# Patient Record
Sex: Male | Born: 1950 | Race: White | Hispanic: No | Marital: Married | Smoking: Former smoker
Health system: Southern US, Community
[De-identification: ages and names within clinical notes are randomized; demographics above are authoritative.]

## PROBLEM LIST (undated history)

## (undated) DIAGNOSIS — I700 Atherosclerosis of aorta: Secondary | ICD-10-CM

## (undated) DIAGNOSIS — E785 Hyperlipidemia, unspecified: Secondary | ICD-10-CM

## (undated) DIAGNOSIS — J309 Allergic rhinitis, unspecified: Secondary | ICD-10-CM

## (undated) DIAGNOSIS — M25559 Pain in unspecified hip: Secondary | ICD-10-CM

## (undated) DIAGNOSIS — K219 Gastro-esophageal reflux disease without esophagitis: Secondary | ICD-10-CM

## (undated) DIAGNOSIS — I6529 Occlusion and stenosis of unspecified carotid artery: Secondary | ICD-10-CM

## (undated) DIAGNOSIS — F419 Anxiety disorder, unspecified: Secondary | ICD-10-CM

## (undated) DIAGNOSIS — R059 Cough, unspecified: Secondary | ICD-10-CM

## (undated) DIAGNOSIS — M1990 Unspecified osteoarthritis, unspecified site: Secondary | ICD-10-CM

## (undated) HISTORY — DX: Allergic rhinitis, unspecified: J30.9

## (undated) HISTORY — DX: Atherosclerosis of aorta: I70.0

## (undated) HISTORY — DX: Gastro-esophageal reflux disease without esophagitis: K21.9

## (undated) HISTORY — DX: Unspecified osteoarthritis, unspecified site: M19.90

## (undated) HISTORY — DX: Hyperlipidemia, unspecified: E78.5

## (undated) HISTORY — PX: HERNIA REPAIR: SHX51

## (undated) HISTORY — DX: Occlusion and stenosis of unspecified carotid artery: I65.29

## (undated) HISTORY — DX: Anxiety disorder, unspecified: F41.9

## (undated) HISTORY — DX: Cough, unspecified: R05.9

## (undated) HISTORY — DX: Pain in unspecified hip: M25.559

---

## 2003-02-06 ENCOUNTER — Ambulatory Visit (HOSPITAL_COMMUNITY): Admission: RE | Admit: 2003-02-06 | Discharge: 2003-02-06 | Payer: Self-pay | Admitting: Internal Medicine

## 2004-06-02 ENCOUNTER — Ambulatory Visit: Payer: Self-pay | Admitting: Internal Medicine

## 2006-07-14 ENCOUNTER — Ambulatory Visit: Payer: Self-pay | Admitting: Internal Medicine

## 2006-08-30 ENCOUNTER — Ambulatory Visit (HOSPITAL_BASED_OUTPATIENT_CLINIC_OR_DEPARTMENT_OTHER): Admission: RE | Admit: 2006-08-30 | Discharge: 2006-08-30 | Payer: Self-pay | Admitting: General Surgery

## 2009-06-20 ENCOUNTER — Ambulatory Visit: Payer: Self-pay | Admitting: Vascular Surgery

## 2009-07-02 ENCOUNTER — Ambulatory Visit: Payer: Self-pay | Admitting: Vascular Surgery

## 2009-07-02 ENCOUNTER — Inpatient Hospital Stay (HOSPITAL_COMMUNITY): Admission: RE | Admit: 2009-07-02 | Discharge: 2009-07-03 | Payer: Self-pay | Admitting: Vascular Surgery

## 2009-07-02 ENCOUNTER — Encounter: Payer: Self-pay | Admitting: Vascular Surgery

## 2009-07-02 HISTORY — PX: CAROTID ENDARTERECTOMY: SUR193

## 2009-07-21 HISTORY — PX: EYE SURGERY: SHX253

## 2009-07-25 ENCOUNTER — Ambulatory Visit: Payer: Self-pay | Admitting: Vascular Surgery

## 2010-02-19 ENCOUNTER — Ambulatory Visit: Payer: Self-pay | Admitting: Vascular Surgery

## 2010-06-11 LAB — COMPREHENSIVE METABOLIC PANEL
ALT: 20 U/L (ref 0–53)
AST: 22 U/L (ref 0–37)
Albumin: 4.4 g/dL (ref 3.5–5.2)
Alkaline Phosphatase: 73 U/L (ref 39–117)
BUN: 9 mg/dL (ref 6–23)
CO2: 27 mEq/L (ref 19–32)
Calcium: 9.6 mg/dL (ref 8.4–10.5)
Chloride: 110 mEq/L (ref 96–112)
Creatinine, Ser: 0.78 mg/dL (ref 0.4–1.5)
GFR calc Af Amer: >60 mL/min (ref >60)
GFR calc non Af Amer: >60 mL/min (ref >60)
Glucose, Bld: 92 mg/dL (ref 70–99)
Potassium: 5 mEq/L (ref 3.5–5.1)
Sodium: 141 mEq/L (ref 135–145)
Total Bilirubin: 0.6 mg/dL (ref 0.3–1.2)
Total Protein: 6.6 g/dL (ref 6.0–8.3)

## 2010-06-11 LAB — URINALYSIS, ROUTINE W REFLEX MICROSCOPIC
Bilirubin Urine: NEGATIVE
Glucose, UA: NEGATIVE mg/dL
Hgb urine dipstick: NEGATIVE
Ketones, ur: NEGATIVE mg/dL
Nitrite: NEGATIVE
Protein, ur: NEGATIVE mg/dL
Specific Gravity, Urine: 1.017 (ref 1.005–1.030)
Urobilinogen, UA: 1 mg/dL (ref 0.0–1.0)
pH: 7.5 (ref 5.0–8.0)

## 2010-06-11 LAB — CBC
HCT: 35 % — ABNORMAL LOW (ref 39.0–52.0)
HCT: 40.6 % (ref 39.0–52.0)
Hemoglobin: 12.1 g/dL — ABNORMAL LOW (ref 13.0–17.0)
Hemoglobin: 13.6 g/dL (ref 13.0–17.0)
MCHC: 33.6 g/dL (ref 30.0–36.0)
MCHC: 34.5 g/dL (ref 30.0–36.0)
MCV: 95.9 fL (ref 78.0–100.0)
MCV: 96.1 fL (ref 78.0–100.0)
Platelets: 131 10*3/uL — ABNORMAL LOW (ref 150–400)
Platelets: 81 10*3/uL — ABNORMAL LOW (ref 150–400)
RBC: 3.65 MIL/uL — ABNORMAL LOW (ref 4.22–5.81)
RBC: 4.22 MIL/uL (ref 4.22–5.81)
RDW: 13 % (ref 11.5–15.5)
RDW: 13.4 % (ref 11.5–15.5)
WBC: 6 10*3/uL (ref 4.0–10.5)
WBC: 7.8 10*3/uL (ref 4.0–10.5)

## 2010-06-11 LAB — TYPE AND SCREEN
ABO/RH(D): O POS
Antibody Screen: NEGATIVE

## 2010-06-11 LAB — BASIC METABOLIC PANEL
BUN: 6 mg/dL (ref 6–23)
CO2: 24 mEq/L (ref 19–32)
Calcium: 9.4 mg/dL (ref 8.4–10.5)
Chloride: 110 mEq/L (ref 96–112)
Creatinine, Ser: 0.82 mg/dL (ref 0.4–1.5)
GFR calc Af Amer: >60 mL/min (ref >60)
GFR calc non Af Amer: >60 mL/min (ref >60)
Glucose, Bld: 140 mg/dL — ABNORMAL HIGH (ref 70–99)
Potassium: 4 mEq/L (ref 3.5–5.1)
Sodium: 142 mEq/L (ref 135–145)

## 2010-06-11 LAB — PROTIME-INR
INR: 0.96 (ref 0.00–1.49)
Prothrombin Time: 12.7 seconds (ref 11.6–15.2)

## 2010-06-11 LAB — APTT: aPTT: 25 seconds (ref 24–37)

## 2010-06-11 LAB — MRSA PCR SCREENING: MRSA by PCR: NEGATIVE

## 2010-06-11 LAB — ABO/RH: ABO/RH(D): O POS

## 2010-08-05 NOTE — Procedures (Signed)
CAROTID DUPLEX EXAM   INDICATION:  Carotid artery disease followup   HISTORY:  Diabetes:  no  Cardiac:  no  Hypertension:  no  Smoking:  Previous  Previous Surgery:  Right carotid endarterectomy 07/02/2009  CV History:  Currently asymptomatic  Amaurosis Fugax No, Paresthesias No, Hemiparesis No                                       RIGHT             LEFT  Brachial systolic pressure:  Brachial Doppler waveforms:  Vertebral direction of flow:        Not visualized    Antegrade  DUPLEX VELOCITIES (cm/sec)  CCA peak systolic                   90                120  ECA peak systolic                   103               89  ICA peak systolic                   76                74  ICA end diastolic                   28                26  PLAQUE MORPHOLOGY:                                    calcific  PLAQUE AMOUNT:                      none              Minimal  PLAQUE LOCATION:                                      ICA, bifurcation   IMPRESSION:  1. No evidence of restenosis, status post right carotid      endarterectomy.  2. Left internal carotid artery velocities are within normal limits.         ___________________________________________  Di Kindle. Edilia Bo, M.D.   EM/MEDQ  D:  02/20/2010  T:  02/20/2010  Job:  [id number]

## 2010-08-05 NOTE — Assessment & Plan Note (Signed)
OFFICE VISIT   WARRICK, LLERA A  DOB:  18-Aug-1950                                       02/19/2010  ZOXWR#:[id number]   Established patient level IV   I saw Mr. Moya in the office today for follow-up of his carotid  disease.  He had undergone a right carotid endarterectomy in April of  this year for an asymptomatic tight right carotid stenosis.  He comes in  for a routine 6 month follow-up visit.  Since I saw him last, he has had  no history of stroke, TIAs, expressive or receptive aphasia or amaurosis  fugax.   His past medical history has not changed since I saw him last.  He does  have a history of hyperlipidemia which has been stable on current  medications.  Medical issues otherwise unremarkable with no history of  diabetes, hypertension, or history of heart disease.   SOCIAL HISTORY:  He is married.  He quit tobacco in May 2008.  He does  not drink alcohol on a regular basis.   REVIEW OF SYSTEMS:  CARDIOVASCULAR:  He has had no chest pain, chest  pressure, palpitations or arrhythmias.  He has had no significant  dyspnea on exertion.  He has had no claudication, rest pain or  nonhealing ulcers.  He has had no history of DVT or phlebitis.  PULMONARY:  He has had no productive cough bronchitis, asthma or  wheezing.   PHYSICAL EXAMINATION:  This is a pleasant 60 year old gentleman who  appears his stated age.  Blood pressure 124/70, heart rate is 71, respiratory rate is 16.  LUNGS:  Clear bilaterally to auscultation without rales, rhonchi or  wheezing.  CARDIOVASCULAR:  I do not detect any carotid bruits.  He has a regular  rate and rhythm.  He has palpable femoral pulses and warm well-perfused  feet.  ABDOMEN:  Soft and nontender with normal pitched bowel sounds.  NEUROLOGIC:  He has no focal weakness or paresthesias.   I did independently interpret his carotid duplex scan which shows that  his right carotid endarterectomy site is widely  patent.  He has no  significant stenosis on the left.   I have reassured him that he has no evidence of recurrent carotid  stenosis on the right.  I have recommended a follow-up carotid duplex  scan in 1 year and I have ordered this study.  I will plan on seeing him  back until 2 years unless there has been any change in his duplex study.  In the meantime, he knows to continue taking his aspirin and let us know  if he develops any new neurologic symptoms.     Di Kindle. Edilia Bo, M.D.  Electronically Signed   CSD/MEDQ  D:  02/19/2010  T:  02/20/2010  Job:  [id number]   cc:   Corky Crafts, MD

## 2010-08-05 NOTE — Op Note (Signed)
NAME:  MARCELUS, DUBBERLY NO.:  [id number]   MEDICAL RECORD NO.:  [id number]          PATIENT TYPE:  AMB   LOCATION:  DSC                          FACILITY:  [facility]   PHYSICIAN:  Gabrielle Dare. Janee Morn, M.D.DATE OF BIRTH:  10-15-1950   DATE OF PROCEDURE:  08/30/2006  DATE OF DISCHARGE:                               OPERATIVE REPORT   PREOPERATIVE DIAGNOSIS:  Left inguinal hernia.   POSTOPERATIVE DIAGNOSIS:  Left inguinal hernia.   PROCEDURE:  Repair of left inguinal hernia with mesh.   SURGEON:  Gabrielle Dare. Janee Morn, M.D.   ANESTHESIA:  General with laryngeal mask airway.   HISTORY OF PRESENT ILLNESS:  Alex [name] is a 60 year old gentleman who  I evaluated in the office for a symptomatic left inguinal hernia.  He  presents today for elective repair.   PROCEDURE IN DETAIL:  Informed consent was obtained.  The patient  received intravenous antibiotics.  A site was marked.  He was brought to  the operating room.  General anesthesia with laryngeal mask airway was  administered by the anesthesia staff.  His abdomen and groins were  prepped and draped in sterile fashion.  Marcaine 0.25% with epinephrine  was injected out towards the left anterior superior iliac spine, and  along our planned left inguinal incision line.  A left inguinal incision  was made.  Subcutaneous tissues were dissected down through Scarpa  fascia, revealing external oblique fascia.  This was somewhat attenuated  medially due to the hernia.  The external oblique fascia was divided  laterally and the division was carried down sharply through the external  ring.  The inferior leaflet was dissected free off the cord structures,  revealing the shelving edge of the inguinal ligament.  The ilioinguinal  nerve was divided, as it was very adherent to the hernia sac; this was  done to prevent postoperative nerve pain.  Next, superior leaflet of the  external oblique was dissected free off the transversalis.   The floor  was inspected, revealing a direct inguinal hernia; it was easily  reducible,  This was dissected free from surrounding tissues and the  cord structures.  It was then reduced manually and was held in the  reduced position,  with a figure-of-eight 2-0 Vicryl suture connecting  the transversalis to the shelving edge of inguinal ligament.  Subsequently the cord was inspected.  There was no evidence of a direct  inguinal hernia.  The hernia repair was then completed with a keyhole  polypropylene mesh.  This was secured medially to the tissues over the  pubic tubercle with 0 Prolene; was secured in a running fashion  inferiorly along the shelving edge of the inguinal ligament with 0  Prolene.  It was then tacked in an interrupted fashion again with 0  Prolene, first to the tissues over the pubic tubercle again, and then  extending out laterally on the superior part of the mesh along the  transversalis.  The two leaflets of the mesh were rejoined behind the  cord, recreating the ring.  These were tacked together and down to the  underlying musculature with  interrupted 0 Prolene sutures.  The aperture  in the mesh was adjusted so that the tip of a fifth digit fit next to  the cord structures.  The cord structures remained viable and  nonedematous.  The area was copiously irrigated.  Additional local  anesthetic was injected.  Meticulous hemostasis was assured.  The  external oblique was then closed with running 2-0 Vicryl sutures.  Subcutaneous tissues were irrigated.  The Scarpa fascia was closed with  interrupted 3-0 Vicryl sutures.  The skin was closed with a running 4-0  Monocryl subcuticular stitch.  Sponge, needle and  instrument counts were correct.  Benzoin, Steri-Strips and sterile  dressings were applied.  The patient's left testicle was relocated in  the anatomic position in the scrotum at completion of the procedure.  He  was taken to the recovery room in stable condition,  after tolerating the  procedure without apparent complication.      Gabrielle Dare Janee Morn, M.D.  Electronically Signed     BET/MEDQ  D:  08/30/2006  T:  08/30/2006  Job:  [id number]   cc:   Corwin Levins, MD

## 2010-08-05 NOTE — Assessment & Plan Note (Signed)
OFFICE VISIT   Alex [name], Alex [name]  DOB:  [birth year]                                       07/25/2009  AVWUJ#:[id number]   I saw the patient in the office today for followup after his recent  right carotid endarterectomy.  This is [name] pleasant 60 year old gentleman  who was referred with [name] greater than 80% right carotid stenosis.  Right  carotid endarterectomy was recommended in order to lower his risk of  future stroke.  He underwent right carotid endarterectomy with Dacron  patch angioplasty on 07/02/2009.  He did well postoperatively and was  discharged on postoperative day #1.  He returns for his first outpatient  visit.  He has no specific complaints and overall has been doing quite  well.  He has had no focal weakness or paresthesias.   PHYSICAL EXAMINATION:  Blood pressure is 132/76, heart rate is 78.  His  neck incision is healing nicely.  Lungs are clear bilaterally to  auscultation.  Neurologic exam is nonfocal.   Overall I am pleased with his progress and I will see him back in 6  months for followup carotid duplex scan.  He did not have any  significant carotid stenosis on the left on his preoperative study.  He  does know to continue taking his aspirin.     Di Kindle. Edilia Bo, M.D.  Electronically Signed   CSD/MEDQ  D:  07/25/2009  T:  07/26/2009  Job:  [id number]   cc:   L. Lupe Carney, M.D.  Corky Crafts, MD

## 2010-08-05 NOTE — Procedures (Signed)
CAROTID DUPLEX EXAM   INDICATION:  Repeat of right carotid for evaluation of critical  stenosis.   HISTORY:  Diabetes:  No.  Cardiac:  No.  Hypertension:  No.  Smoking:  Previous.  Previous Surgery:  No.  CV History:  Asymptomatic.  Amaurosis Fugax Yes No, Paresthesias Yes No, Hemiparesis Yes No                                       RIGHT             LEFT  Brachial systolic pressure:  Brachial Doppler waveforms:  Vertebral direction of flow:        Antegrade  DUPLEX VELOCITIES (cm/sec)  CCA peak systolic                   80  ECA peak systolic                   113  ICA peak systolic                   313  ICA end diastolic                   119  PLAQUE MORPHOLOGY:                  Calcified / mixed  PLAQUE AMOUNT:                      Critical  PLAQUE LOCATION:                    Bifurcation and ICA   IMPRESSION:  80%-99% stenosis noted in the right internal carotid  artery.   ___________________________________________  Di Kindle. Edilia Bo, M.D.   CJ/MEDQ  D:  06/20/2009  T:  06/20/2009  Job:  [id number]

## 2010-08-05 NOTE — H&P (Signed)
HISTORY AND PHYSICAL EXAMINATION   June 20, 2009   Re:  Alex [name], Alex [name]             DOB:  08/29/1950   I saw the patient in the office today in consultation concerning [name]  greater than 80% right carotid stenosis.  This is [name] pleasant 60 year old  right-handed gentleman who was found to have [name] carotid bruit.  This  prompted [name] duplex scan which showed evidence of an 80% right carotid  stenosis.  He was sent for vascular consultation.  Of note, he denies  any history of stroke, TIAs, expressive or receptive aphasia, or  amaurosis fugax.   PAST MEDICAL HISTORY:  Is significant for hyperlipidemia which has been  stable on his current medications.  He also has [name] history of tobacco  abuse in the past but quit in May of 2008.  His past medical history is  otherwise fairly unremarkable.  He denies any history of diabetes,  hypertension, history of previous myocardial infarction, history of  congestive heart failure or history of COPD.   PAST SURGICAL HISTORY:  Significant for hernia repair in 2008.   FAMILY HISTORY:  There is no history of premature cardiovascular  disease.   SOCIAL HISTORY:  He is married.  He has one child.  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  He quit tobacco in May of 2008.  He does  not drink alcohol on [name] regular basis.   ALLERGIES:  No known drug allergies.   MEDICATIONS:  1. Simvastatin 40 mg p.o. daily.  2. Aspirin 81 mg p.o. daily.  3. Multivitamin one p.o. daily.   REVIEW OF SYSTEMS:  GENERAL:  He has had no recent weight loss, weight  gain or problem with his appetite.  CARDIOVASCULAR:  He has had no chest pain, chest pressure, palpitations  or arrhythmias.  He does admit to some dyspnea on exertion.  He has had  no claudication, rest pain or nonhealing ulcers.  He has had no history  of DVT or phlebitis.  MUSCULOSKELETAL:  He does have occasional arthritic pain in his knees.  Pulmonary, GI, GU, neurologic, psychiatric,  ENT and hematologic review  of systems is unremarkable and is documented on the medical history form  in his chart.   PHYSICAL EXAMINATION:  General:  This is [name] pleasant 60 year old  gentleman who appears his stated age.  Vital signs:  His blood pressure  is 129/70, heart rate is 70, respiratory rate 24.  HEENT:  Unremarkable.  Lungs:  Are clear bilaterally to auscultation without rales, rhonchi or  wheezing.  Cardiovascular:  He has bilateral carotid bruits.  He has [name]  regular rate and rhythm without murmur.  He has no significant  peripheral edema.  He has palpable femoral, radial and posterior tibial  pulses bilaterally.  Abdomen:  Soft and nontender with normal pitched  bowel sounds.  I cannot appreciate an aneurysm.  No masses are  appreciated.  Musculoskeletal:  There are no major deformities or  cyanosis.  Neurologic:  He has no focal weakness or paresthesias.  Skin:  There are no ulcers or rashes.   I have independently interpreted his carotid duplex scan of the right  carotid artery in our office to confirm the findings from the duplex at  [facility].  He has [name] peak systolic velocity of 313 cm/second, an  end-diastolic velocity of 119 cm/second consistent with an 80%-99% right  internal carotid artery stenosis.  I also reviewed his previous Doppler  which showed no evidence of significant carotid stenosis on the left.  I  also reviewed his laboratory evaluation which shows [name] creatinine of 0.9.  His white count was 6.5.  H and H of 15 and 44.   Given the severity of the right carotid stenosis I have recommended  right carotid endarterectomy in order to lower his risk of future  stroke.  We have discussed the indications for surgery and the potential  complications including but not limited to bleeding, stroke (peri  procedural risk 1-2%), nerve injury, MI, or other unpredictable medical  problems.  All of his questions were answered and he is agreeable to  proceed.  His  surgery has been scheduled for April 12.  He knows to  continue his aspirin right up through surgery.     Di Kindle. Edilia Bo, M.D.  Electronically Signed   CSD/MEDQ  D:  06/20/2009  T:  06/21/2009  Job:  [id number]   cc:   Corky Crafts, MD  L. Lupe Carney, M.D.

## 2011-01-08 LAB — POCT HEMOGLOBIN-HEMACUE
Hemoglobin: 13.1
Operator id: 23949

## 2011-02-16 ENCOUNTER — Ambulatory Visit (INDEPENDENT_AMBULATORY_CARE_PROVIDER_SITE_OTHER): Payer: 59 | Admitting: *Deleted

## 2011-02-16 DIAGNOSIS — I6529 Occlusion and stenosis of unspecified carotid artery: Secondary | ICD-10-CM

## 2011-02-16 DIAGNOSIS — Z48812 Encounter for surgical aftercare following surgery on the circulatory system: Secondary | ICD-10-CM

## 2011-02-19 ENCOUNTER — Other Ambulatory Visit: Payer: Self-pay

## 2011-02-20 ENCOUNTER — Encounter: Payer: Self-pay | Admitting: Vascular Surgery

## 2011-02-20 NOTE — Procedures (Unsigned)
CAROTID DUPLEX EXAM  INDICATION:  Carotid endarterectomy.  HISTORY: Diabetes:  No. Cardiac:  No. Hypertension:  No. Smoking:  Previous. Previous Surgery:  Right carotid endarterectomy on 07/02/2009. CV History:  Currently asymptomatic. Amaurosis Fugax No, Paresthesias No, Hemiparesis No.                                      RIGHT             LEFT Brachial systolic pressure:         132               130 Brachial Doppler waveforms:         Normal            Normal Vertebral direction of flow:        Antegrade/resistive                 Antegrade DUPLEX VELOCITIES (cm/sec) CCA peak systolic                   93                106 ECA peak systolic                   91                92 ICA peak systolic                   75                96 ICA end diastolic                   32                28 PLAQUE MORPHOLOGY:                                    Heterogenous PLAQUE AMOUNT:                      None              Mild PLAQUE LOCATION:                                      ICA/ECA  IMPRESSION: 1. Patent right carotid endarterectomy site with no hemodynamically     significant stenosis of the bilateral internal carotid arteries. 2. The antegrade right vertebral artery demonstrates resistive flow. 3. No significant change noted when compared to the previous     examination on 06/19/2009.  ___________________________________________ Di Kindle. Edilia Bo, M.D.  CH/MEDQ  D:  02/17/2011  T:  02/17/2011  Job:  [id number]

## 2011-02-23 ENCOUNTER — Other Ambulatory Visit: Payer: Self-pay

## 2011-02-23 DIAGNOSIS — I6529 Occlusion and stenosis of unspecified carotid artery: Secondary | ICD-10-CM

## 2011-02-23 DIAGNOSIS — I6359 Cerebral infarction due to unspecified occlusion or stenosis of other cerebral artery: Secondary | ICD-10-CM

## 2011-02-23 DIAGNOSIS — Z48812 Encounter for surgical aftercare following surgery on the circulatory system: Secondary | ICD-10-CM

## 2012-02-05 ENCOUNTER — Encounter: Payer: Self-pay | Admitting: Vascular Surgery

## 2012-02-17 ENCOUNTER — Other Ambulatory Visit: Payer: 59

## 2012-02-17 ENCOUNTER — Ambulatory Visit: Payer: 59 | Admitting: Vascular Surgery

## 2012-02-23 ENCOUNTER — Encounter: Payer: Self-pay | Admitting: Neurosurgery

## 2012-02-24 ENCOUNTER — Ambulatory Visit (INDEPENDENT_AMBULATORY_CARE_PROVIDER_SITE_OTHER): Payer: 59 | Admitting: Neurosurgery

## 2012-02-24 ENCOUNTER — Encounter: Payer: Self-pay | Admitting: Neurosurgery

## 2012-02-24 ENCOUNTER — Ambulatory Visit (INDEPENDENT_AMBULATORY_CARE_PROVIDER_SITE_OTHER): Payer: 59 | Admitting: *Deleted

## 2012-02-24 VITALS — BP 108/62 | HR 85 | Resp 18 | Ht 67.0 in | Wt 140.0 lb

## 2012-02-24 DIAGNOSIS — Z48812 Encounter for surgical aftercare following surgery on the circulatory system: Secondary | ICD-10-CM

## 2012-02-24 DIAGNOSIS — I6529 Occlusion and stenosis of unspecified carotid artery: Secondary | ICD-10-CM

## 2012-02-24 NOTE — Progress Notes (Signed)
[facility] Carotid Office Note  CC: Carotid surveillance Referring Physician: Edilia Bo  History of Present Illness: 61 year old male patient of Dr. Edilia Bo status post right CEA in 2011. The patient denies any signs or symptoms of CVA, TIA, amaurosis fugax or any neural deficit. The patient denies any new medical diagnoses or recent surgery.  Past Medical History  Diagnosis Date  . Carotid artery occlusion   . Hyperlipidemia   . Arthritis     ROS: [x]  Positive   [ ]  Denies    General: [ ]  Weight loss, [ ]  Fever, [ ]  chills Neurologic: [ ]  Dizziness, [ ]  Blackouts, [ ]  Seizure [ ]  Stroke, [ ]  "Mini stroke", [ ]  Slurred speech, [ ]  Temporary blindness; [ ]  weakness in arms or legs, [ ]  Hoarseness Cardiac: [ ]  Chest pain/pressure, [ ]  Shortness of breath at rest [ ]  Shortness of breath with exertion, [ ]  Atrial fibrillation or irregular heartbeat Vascular: [ ]  Pain in legs with walking, [ ]  Pain in legs at rest, [ ]  Pain in legs at night,  [ ]  Non-healing ulcer, [ ]  Blood clot in vein/DVT,   Pulmonary: [ ]  Home oxygen, [ ]  Productive cough, [ ]  Coughing up blood, [ ]  Asthma,  [ ]  Wheezing Musculoskeletal:  [ ]  Arthritis, [ ]  Low back pain, [ ]  Joint pain Hematologic: [ ]  Easy Bruising, [ ]  Anemia; [ ]  Hepatitis Gastrointestinal: [ ]  Blood in stool, [ ]  Gastroesophageal Reflux/heartburn, [ ]  Trouble swallowing Urinary: [ ]  chronic Kidney disease, [ ]  on HD - [ ]  MWF or [ ]  TTHS, [ ]  Burning with urination, [ ]  Difficulty urinating Skin: [ ]  Rashes, [ ]  Wounds Psychological: [ ]  Anxiety, [ ]  Depression   Social History History  Substance Use Topics  . Smoking status: Former Smoker    Types: Cigarettes    Quit date: 06/21/2005  . Smokeless tobacco: Never Used  . Alcohol Use: Yes    Family History Family History  Problem Relation Age of Onset  . Family history unknown: Yes    No Known Allergies  Current Outpatient Prescriptions  Medication  Sig Dispense Refill  . aspirin 81 MG tablet Take 81 mg by mouth daily.      Marland Kitchen imipramine (TOFRANIL) 25 MG tablet daily.      . Multiple Vitamin (MULTIVITAMIN) tablet Take 1 tablet by mouth daily.      . simvastatin (ZOCOR) 40 MG tablet Take 40 mg by mouth every evening.        Physical Examination  Filed Vitals:   02/24/12 1341  BP: 108/62  Pulse: 85  Resp:     Body mass index is 21.93 kg/(m^2).  General:  WDWN in NAD Gait: Normal HEENT: WNL Eyes: Pupils equal Pulmonary: normal non-labored breathing , without Rales, rhonchi,  wheezing Cardiac: RRR, without  Murmurs, rubs or gallops; Abdomen: soft, NT, no masses Skin: no rashes, ulcers noted  Vascular Exam Pulses: 3+ radial pulses bilateral Carotid bruits: Carotid pulses to auscultation no bruits are heard Extremities without ischemic changes, no Gangrene , no cellulitis; no open wounds;  Musculoskeletal: no muscle wasting or atrophy   Neurologic: A&O X 3; Appropriate Affect ; SENSATION: normal; MOTOR FUNCTION:  moving all extremities equally. Speech is fluent/normal  Non-Invasive Vascular Imaging CAROTID DUPLEX 02/24/2012  Right ICA 0 - 19% stenosis Left ICA 20 - 39 % stenosis   ASSESSMENT/PLAN: Asymptomatic patient to year status post right CEA doing well. The  patient will followup in one year with repeat carotid duplex. The patient's questions were encouraged and answered, he is in agreement with this plan.  Lauree Chandler ANP   Clinic MD: Edilia Bo

## 2012-02-25 NOTE — Addendum Note (Signed)
Addended by: Sharee Pimple on: 02/25/2012 11:56 AM   Modules accepted: Orders

## 2013-02-06 ENCOUNTER — Ambulatory Visit (INDEPENDENT_AMBULATORY_CARE_PROVIDER_SITE_OTHER): Payer: BC Managed Care – PPO | Admitting: Interventional Cardiology

## 2013-02-06 ENCOUNTER — Encounter: Payer: Self-pay | Admitting: Interventional Cardiology

## 2013-02-06 VITALS — BP 108/64 | HR 81 | Ht 67.0 in | Wt 139.0 lb

## 2013-02-06 DIAGNOSIS — E782 Mixed hyperlipidemia: Secondary | ICD-10-CM | POA: Insufficient documentation

## 2013-02-06 DIAGNOSIS — I739 Peripheral vascular disease, unspecified: Secondary | ICD-10-CM

## 2013-02-06 DIAGNOSIS — R0600 Dyspnea, unspecified: Secondary | ICD-10-CM

## 2013-02-06 DIAGNOSIS — R0609 Other forms of dyspnea: Secondary | ICD-10-CM

## 2013-02-06 NOTE — Progress Notes (Signed)
Patient ID: Alex [name], male   DOB: 08-14-1950, 62 y.o.   MRN: [id number]    [address] [place], [place]  [address] Phone: [phone] Fax:  [phone]  Date:  02/06/2013   ID:  Alex [name], DOB [birth year], MRN [id number]  PCP:  Lupe Carney, MD      History of Present Illness: [name] is a 62 y.o. male who has had a CEA in 2011. He walks and hikes. He feels SHOB with walking up stairs and steep hills. It goes away after a few minutes. No hard chest pain. No change in that symptom. He just feels he cannot catch his breath.  His exercise tolerance has dropped.  Has occasional dizziness. Not related to exertion.  Walks up stairs. Hyperlipidemia:  Denies : Chest pain.  Palpitations.  Dizziness.  Leg edema.  Orthopnea.     Wt Readings from Last 3 Encounters:  02/06/13 139 lb (63.05 kg)  02/24/12 140 lb (63.504 kg)     Past Medical History  Diagnosis Date  . Carotid artery occlusion   . Hyperlipidemia   . Arthritis     Current Outpatient Prescriptions  Medication Sig Dispense Refill  . aspirin 81 MG tablet Take 81 mg by mouth daily.      Marland Kitchen imipramine (TOFRANIL) 25 MG tablet Take 25 mg by mouth as needed.       . Multiple Vitamin (MULTIVITAMIN) tablet Take 1 tablet by mouth daily.      . simvastatin (ZOCOR) 40 MG tablet Take 40 mg by mouth every evening.       No current facility-administered medications for this visit.    Allergies:   No Known Allergies  Social History:  The patient  reports that he quit smoking about 7 years ago. His smoking use included Cigarettes. He smoked 0.00 packs per day. He has never used smokeless tobacco. He reports that he drinks alcohol. He reports that he uses illicit drugs.   Family History:  The patient's family history includes Diabetes in his brother.   ROS:  Please see the history of present illness.  No nausea, vomiting.  No fevers, chills.  No focal weakness.  No dysuria. SHOB as noted above.  All other systems reviewed and negative.   PHYSICAL EXAM: VS:  BP 108/64  Pulse 81  Ht 5\' 7"  (1.702 m)  Wt 139 lb (63.05 kg)  BMI 21.77 kg/m2 Well nourished, well developed, in no acute distress HEENT: normal Neck: no JVD, no carotid bruits Cardiac:  normal S1, S2; RRR;  Lungs:  clear to auscultation bilaterally, no wheezing, rhonchi or rales Abd: soft, nontender, no hepatomegaly Ext: no edema Skin: warm and dry Neuro:   no focal abnormalities noted  EKG:  NSR, IRBBB , T wave inversion in V1    ASSESSMENT AND PLAN:  1. DOE: Patient with peripheral vascular disease. We have discussed stress testing in the past. His dyspnea on exertion is now worse. We'll plan for nuclear stress test. Also evaluate left ventricular ejection fraction. He does have some ST segment changes in V1 with downsloping ST depression. 2. Carotid artery disease: Following at [facility]. 3. Hyperlipidemia: Continue Zocor.  LDL 71 in March 2014.  Signed, Fredric Mare, MD, [facility] 02/06/2013 3:43 PM

## 2013-02-06 NOTE — Patient Instructions (Signed)
Your physician has requested that you have en exercise stress myoview. For further information please visit [email or web address] Please follow instruction sheet, as given.  Your physician wants you to follow-up in: 1 year with Dr. Eldridge Dace. You will receive a reminder letter in the mail two months in advance. If you don't receive a letter, please call our office to schedule the follow-up appointment.  Your physician recommends that you continue on your current medications as directed. Please refer to the Current Medication list given to you today.

## 2013-02-24 ENCOUNTER — Encounter: Payer: Self-pay | Admitting: Family

## 2013-02-27 ENCOUNTER — Ambulatory Visit (HOSPITAL_COMMUNITY)
Admission: RE | Admit: 2013-02-27 | Discharge: 2013-02-27 | Disposition: A | Discharge status: Home / Self Care | Payer: BC Managed Care – PPO | Source: Ambulatory Visit | Attending: Family | Admitting: Family

## 2013-02-27 ENCOUNTER — Ambulatory Visit: Payer: Self-pay | Admitting: Family

## 2013-02-27 DIAGNOSIS — Z48812 Encounter for surgical aftercare following surgery on the circulatory system: Secondary | ICD-10-CM

## 2013-02-27 DIAGNOSIS — I6529 Occlusion and stenosis of unspecified carotid artery: Secondary | ICD-10-CM | POA: Insufficient documentation

## 2013-02-28 ENCOUNTER — Other Ambulatory Visit: Payer: Self-pay | Admitting: *Deleted

## 2013-02-28 DIAGNOSIS — Z48812 Encounter for surgical aftercare following surgery on the circulatory system: Secondary | ICD-10-CM

## 2013-03-01 ENCOUNTER — Other Ambulatory Visit (HOSPITAL_COMMUNITY): Payer: 59

## 2013-03-01 ENCOUNTER — Ambulatory Visit: Payer: 59 | Admitting: Family

## 2013-03-06 ENCOUNTER — Encounter: Payer: Self-pay | Admitting: Cardiovascular Disease

## 2013-03-06 ENCOUNTER — Ambulatory Visit (HOSPITAL_COMMUNITY): Payer: BC Managed Care – PPO | Attending: Cardiovascular Disease | Admitting: Radiology

## 2013-03-06 VITALS — BP 117/81 | Ht 67.0 in | Wt 136.0 lb

## 2013-03-06 DIAGNOSIS — R0600 Dyspnea, unspecified: Secondary | ICD-10-CM

## 2013-03-06 DIAGNOSIS — R0609 Other forms of dyspnea: Secondary | ICD-10-CM | POA: Insufficient documentation

## 2013-03-06 DIAGNOSIS — I739 Peripheral vascular disease, unspecified: Secondary | ICD-10-CM | POA: Insufficient documentation

## 2013-03-06 DIAGNOSIS — R0602 Shortness of breath: Secondary | ICD-10-CM

## 2013-03-06 DIAGNOSIS — R0989 Other specified symptoms and signs involving the circulatory and respiratory systems: Secondary | ICD-10-CM | POA: Insufficient documentation

## 2013-03-06 DIAGNOSIS — R079 Chest pain, unspecified: Secondary | ICD-10-CM

## 2013-03-06 MED ORDER — TECHNETIUM TC 99M SESTAMIBI GENERIC - CARDIOLITE
10.0000 | Freq: Once | INTRAVENOUS | Status: AC | PRN
  Administered 2013-03-06: 10 via INTRAVENOUS

## 2013-03-06 MED ORDER — TECHNETIUM TC 99M SESTAMIBI GENERIC - CARDIOLITE
30.0000 | Freq: Once | INTRAVENOUS | Status: AC | PRN
  Administered 2013-03-06: 30 via INTRAVENOUS

## 2013-03-06 NOTE — Progress Notes (Signed)
[facility] SITE 3 NUCLEAR MED [address] [place], [place] [address] [phone]    Cardiology Nuclear Med Study  Alex [name] is a 62 y.o. male     MRN : [id number]     DOB: Oct 19, 1950  Procedure Date: 03/06/2013  Nuclear Med Background Indication for Stress Test:  Evaluation for Ischemia History:  No known History of CAD Cardiac Risk Factors: Carotid Disease, History of Smoking, Lipids and PVD  Symptoms:  Dizziness and DOE   Nuclear Pre-Procedure Caffeine/Decaff Intake:  None NPO After: 7:00pm   Lungs:  clear O2 Sat: 95% on room air. IV 0.9% NS with Angio Cath:  22g  IV Site: R Hand  IV Started by:  Cathlyn Parsons, RN  Chest Size (in):  38 Cup Size: n/a  Height: 5\' 7"  (1.702 m)  Weight:  136 lb (61.689 kg)  BMI:  Body mass index is 21.3 kg/(m^2). Tech Comments:  I had Dr. Eldridge Dace review the Stress Test, particularly the BP response, prior to patient leaving. He stated it was okay for patient to leave, and his office will f/u with final results.  Leonia Corona, RT-N    Nuclear Med Study 1 or 2 day study: 1 day  Stress Test Type:  Stress  Reading MD: Lance Muss, MD  Order Authorizing Provider:  Floydene Flock  Resting Radionuclide: Technetium 76m Sestamibi  Resting Radionuclide Dose: 11.0 mCi   Stress Radionuclide:  Technetium 29m Sestamibi  Stress Radionuclide Dose: 33.0 mCi           Stress Protocol Rest HR: 80 Stress HR: 136  Rest BP: 117/81 Stress BP: 177/77  Exercise Time (min): 6.20 METS: 6.20   Predicted Max HR: 156 bpm % Max HR: 87.18 bpm Rate Pressure Product: 21308   Dose of Adenosine (mg):  n/a Dose of Lexiscan: n/a mg  Dose of Atropine (mg): n/a Dose of Dobutamine: n/a mcg/kg/min (at max HR)  Stress Test Technologist: Frederick Peers, EMT-P  Nuclear Technologist:  Domenic Polite, CNMT     Rest Procedure:  Myocardial perfusion imaging was performed at rest 45 minutes following the intravenous administration of Technetium 26m  Sestamibi. Rest ECG: NSR, IRBBB  Stress Procedure:  The patient exercised on the treadmill utilizing the Bruce Protocol for 4:48 minutes. The patient stopped due to sob/leg fatigue and denied any chest pain.  Technetium 31m Sestamibi was injected at peak exercise and myocardial perfusion imaging was performed after a brief delay. and The patient received IV Lexiscan 0.4 mg over 15-seconds.  Technetium 47m Sestamibi injected at 30-seconds.  Quantitative spect images were obtained after a 45 minute delay. Stress ECG: No significant change from baseline ECG  QPS Raw Data Images:  Mild diaphragmatic attenuation.  Normal left ventricular size. Stress Images:  Normal homogeneous uptake in all areas of the myocardium. Rest Images:  There is decreased uptake in the inferior wall. Subtraction (SDS):  No evidence of ischemia. Transient Ischemic Dilatation (Normal <1.22):  0.89 Lung/Heart Ratio (Normal <0.45):  0.28  Quantitative Gated Spect Images QGS EDV:  62 ml QGS ESV:  23 ml  Impression Exercise Capacity:  Fair exercise capacity. BP Response:  Hypotensive blood pressure response. Clinical Symptoms:  There is dyspnea. ECG Impression:  No significant ST segment change suggestive of ischemia. Comparison with Prior Nuclear Study: No previous nuclear study performed  Overall Impression:  Low risk stress nuclear study .  Normal perfusion and TID ratio.  No ECG changes despite hypotension with exercise.  No chest  pain.  BP resolved post exercise.    . Let us know if he has any further symptoms.  Could consider angiography.  LV Ejection Fraction: 64%.  LV Wall Motion:  NL LV Function; NL Wall Motion  Corky Crafts., MD, [facility]

## 2013-03-07 ENCOUNTER — Encounter: Payer: Self-pay | Admitting: Vascular Surgery

## 2013-07-11 ENCOUNTER — Other Ambulatory Visit: Payer: Self-pay | Admitting: Interventional Cardiology

## 2014-02-07 ENCOUNTER — Ambulatory Visit (INDEPENDENT_AMBULATORY_CARE_PROVIDER_SITE_OTHER): Payer: BC Managed Care – PPO | Admitting: Interventional Cardiology

## 2014-02-07 ENCOUNTER — Encounter: Payer: Self-pay | Admitting: Interventional Cardiology

## 2014-02-07 VITALS — BP 114/72 | HR 78 | Ht 67.0 in | Wt 134.0 lb

## 2014-02-07 DIAGNOSIS — E782 Mixed hyperlipidemia: Secondary | ICD-10-CM

## 2014-02-07 NOTE — Patient Instructions (Signed)
Your physician recommends that you continue on your current medications as directed. Please refer to the Current Medication list given to you today.  Your physician recommends that you schedule a follow-up appointment AS NEEDED with Dr. Irish Lack.

## 2014-02-07 NOTE — Progress Notes (Signed)
Patient ID: Alex [name], male   DOB: May 23, 1950, 63 y.o.   MRN: [id number] Patient ID: Alex [name], male   DOB: 1950/04/22, 63 y.o.   MRN: [id number]    [place], [place], [place]  [address] Phone: [phone] Fax:  [phone]  Date:  02/07/2014   ID:  [place] [name], DOB 06-May-1950, MRN [id number]  PCP:  Donnie Coffin, MD      History of Present Illness: Alex [name] is a 63 y.o. male who has had a CEA in 2011. He walks and hikes. He feels SHOB with walking up stairs and steep hills. It goes away after a few minutes. No hard chest pain. No change in that symptom. Negative stress test in 12/14.   Has occasional dizziness. Not related to exertion. More with change in position.  Walks up stairs. Hyperlipidemia:  Denies : Chest pain.  Palpitations.  Dizziness.  Leg edema.  Orthopnea.     Wt Readings from Last 3 Encounters:  02/07/14 134 lb (60.782 kg)  03/06/13 136 lb (61.689 kg)  02/06/13 139 lb (63.05 kg)     Past Medical History  Diagnosis Date  . Carotid artery occlusion   . Hyperlipidemia   . Arthritis     Current Outpatient Prescriptions  Medication Sig Dispense Refill  . aspirin 81 MG tablet Take 81 mg by mouth daily.    . clonazePAM (KLONOPIN) 0.5 MG tablet Take 0.5 mg by mouth 2 (two) times daily as needed for anxiety.    Marland Kitchen imipramine (TOFRANIL) 25 MG tablet Take 25 mg by mouth as needed.     . Multiple Vitamin (MULTIVITAMIN) tablet Take 1 tablet by mouth daily.    . simvastatin (ZOCOR) 40 MG tablet TAKE 1 TABLET EVERY EVENING 90 tablet 1   No current facility-administered medications for this visit.    Allergies:   No Known Allergies  Social History:  The patient  reports that he quit smoking about 8 years ago. His smoking use included Cigarettes. He smoked 0.00 packs per day. He has never used smokeless tobacco. He reports that he drinks alcohol. He reports that he uses illicit drugs.   Family History:  The patient's  family history includes Diabetes in his brother.   ROS:  Please see the history of present illness.  No nausea, vomiting.  No fevers, chills.  No focal weakness.  No dysuria. SHOB as noted above. All other systems reviewed and negative.   PHYSICAL EXAM: VS:  BP 114/72 mmHg  Pulse 78  Ht 5\' 7"  (1.702 m)  Wt 134 lb (60.782 kg)  BMI 20.98 kg/m2 Well nourished, well developed, in no acute distress HEENT: normal Neck: no JVD, no carotid bruits Cardiac:  normal S1, S2; RRR;  Lungs:  clear to auscultation bilaterally, no wheezing, rhonchi or rales Abd: soft, nontender, no hepatomegaly Ext: no edema Skin: warm and dry Neuro:   no focal abnormalities noted  EKG:  NSR, IRBBB , T wave inversion in V1    ASSESSMENT AND PLAN:  1. DOE: Stable.  Negative nuclear stress test in 2014 with normal left ventricular ejection fraction. He does have some ST segment changes in V1 with downsloping ST depression. Unchanged ECG.  2. Carotid artery disease: Following at [facility]. 3. Hyperlipidemia: Continue Zocor.  LDL 71 in March 2014.  Followed by PMD.   F/u prn.  Signed, Mina Marble, MD, [facility] 02/07/2014 4:46 PM

## 2014-03-06 ENCOUNTER — Encounter: Payer: Self-pay | Admitting: Family

## 2014-03-07 ENCOUNTER — Encounter: Payer: Self-pay | Admitting: Family

## 2014-03-07 ENCOUNTER — Ambulatory Visit (INDEPENDENT_AMBULATORY_CARE_PROVIDER_SITE_OTHER): Payer: BC Managed Care – PPO | Admitting: Family

## 2014-03-07 ENCOUNTER — Ambulatory Visit (HOSPITAL_COMMUNITY)
Admission: RE | Admit: 2014-03-07 | Discharge: 2014-03-07 | Disposition: A | Discharge status: Home / Self Care | Payer: BC Managed Care – PPO | Source: Ambulatory Visit | Attending: Family | Admitting: Family

## 2014-03-07 ENCOUNTER — Other Ambulatory Visit: Payer: Self-pay | Admitting: Vascular Surgery

## 2014-03-07 VITALS — BP 105/63 | HR 91 | Resp 16 | Ht 68.0 in | Wt 136.0 lb

## 2014-03-07 DIAGNOSIS — Z48812 Encounter for surgical aftercare following surgery on the circulatory system: Secondary | ICD-10-CM

## 2014-03-07 DIAGNOSIS — I6523 Occlusion and stenosis of bilateral carotid arteries: Secondary | ICD-10-CM

## 2014-03-07 DIAGNOSIS — Z9889 Other specified postprocedural states: Secondary | ICD-10-CM

## 2014-03-07 NOTE — Progress Notes (Signed)
Established Carotid Patient   History of Present Illness  [place] [name] is a 63 y.o. male patient of Dr. Scot Dock who is status post right CEA in 2011. The patient returns today for follow up.  The patient denies any history of TIA or stroke symptoms, specifically the patient denies a history of amaurosis fugax or monocular blindness, denies a history unilateral or facial drooping, denies a history of hemiplegia, and denies a history of receptive or expressive aphasia.    The pt denies claudication symptoms with walking, denies non healing wounds.   The patient denies New Medical or Surgical History.  Pt Diabetic: No Pt smoker: former smoker, quit in 2007  Pt meds include: Statin : Yes ASA: Yes Other anticoagulants/antiplatelets: no   Past Medical History  Diagnosis Date  . Carotid artery occlusion   . Hyperlipidemia   . Arthritis     Social History History  Substance Use Topics  . Smoking status: Former Smoker    Types: Cigarettes    Quit date: 06/21/2005  . Smokeless tobacco: Never Used  . Alcohol Use: Yes    Family History Family History  Problem Relation Age of Onset  . Diabetes Brother     Surgical History Past Surgical History  Procedure Laterality Date  . Hernia repair    . Carotid endarterectomy  July 02, 2009    Right cea  . Eye surgery  May  2011    Bilateral cataract    No Known Allergies  Current Outpatient Prescriptions  Medication Sig Dispense Refill  . aspirin 81 MG tablet Take 81 mg by mouth daily.    . clonazePAM (KLONOPIN) 0.5 MG tablet Take 0.5 mg by mouth 2 (two) times daily as needed for anxiety.    Marland Kitchen imipramine (TOFRANIL) 25 MG tablet Take 25 mg by mouth as needed.     . Multiple Vitamin (MULTIVITAMIN) tablet Take 1 tablet by mouth daily.    . simvastatin (ZOCOR) 40 MG tablet TAKE 1 TABLET EVERY EVENING 90 tablet 1   No current facility-administered medications for this visit.    Review of Systems : See HPI for pertinent  positives and negatives.  Physical Examination  Filed Vitals:   03/07/14 1354 03/07/14 1358  BP: 112/70 105/63  Pulse: 83 91  Resp:  16  Height:  5\' 8"  (1.727 m)  Weight:  136 lb (61.689 kg)  SpO2:  96%   Body mass index is 20.68 kg/(m^2).  General: WDWN male in NAD GAIT: normal Eyes: PERRLA Pulmonary:  Non-labored, CTAB, Negative  Rales, Negative rhonchi, & Negative wheezing.  Cardiac: regular Rhythm,  Negative detected murmur.  VASCULAR EXAM Carotid Bruits Right Left   Negative Negative     Radial pulses are 2+ palpable and equal.                                                                                                                            LE Pulses Right Left  POPLITEAL  not palpable   not palpable       POSTERIOR TIBIAL   palpable    palpable        DORSALIS PEDIS      ANTERIOR TIBIAL not palpable  not palpable     Gastrointestinal: soft, nontender, BS WNL, no r/g,  negative palpated masses.  Musculoskeletal: Negative muscle atrophy/wasting. M/S 5/5 throughout, Extremities without ischemic changes.  Neurologic: A&O X 3; Appropriate Affect, Speech is normal CN 2-12 intact, Pain and light touch intact in extremities, Motor exam as listed above.   Non-Invasive Vascular Imaging CAROTID DUPLEX 03/07/2014   CEREBROVASCULAR DUPLEX EVALUATION    INDICATION: Carotid stenosis    PREVIOUS INTERVENTION(S): Right carotid endarterectomy 07/02/2009.    DUPLEX EXAM:     RIGHT  LEFT  Peak Systolic Velocities (cm/s) End Diastolic Velocities (cm/s) Plaque LOCATION Peak Systolic Velocities (cm/s) End Diastolic Velocities (cm/s) Plaque  103 17  CCA PROXIMAL 130 26   78 19  CCA MID 98 24   63 15 HT CCA DISTAL 58 15 HT  81 11 HT ECA 96 16 HT  57 21 HT ICA PROXIMAL 82 23 HT  70 24  ICA MID 63 22   70 25  ICA DISTAL 61 22     carotid endarterectomy ICA / CCA Ratio (PSV) 0.84  Antegrade Vertebral Flow Antegrade  465 Brachial Systolic Pressure (mmHg)  120  Triphasic Brachial Artery Waveforms Triphasic    Plaque Morphology:  HM = Homogeneous, HT = Heterogeneous, CP = Calcific Plaque, SP = Smooth Plaque, IP = Irregular Plaque     ADDITIONAL FINDINGS:     IMPRESSION: Right internal carotid artery is patent with history of carotid endarterectomy. Left internal carotid artery stenosis present in the less than 40% range.    Compared to the previous exam:  Unchanged since previous study on 02/27/2013      Assessment: [name] is a 63 y.o. male who presents with asymptomatic patent right ICA with history of carotid endarterectomy and less than 40% left ICA stenosis, unchanged since previous study on 02/27/2013.   Plan: Follow-up in 1 year with Carotid Duplex.   I discussed in depth with the patient the nature of atherosclerosis, and emphasized the importance of maximal medical management including strict control of blood pressure, blood glucose, and lipid levels, obtaining regular exercise, and continued cessation of smoking.  The patient is aware that without maximal medical management the underlying atherosclerotic disease process will progress, limiting the benefit of any interventions. The patient was given information about stroke prevention and what symptoms should prompt the patient to seek immediate medical care. Thank you for allowing [place] to participate in this patient's care.  Clemon Chambers, RN, MSN, FNP-C [facility] Office: [phone]  Clinic Physician: Scot Dock  03/07/2014 2:10 PM

## 2014-03-07 NOTE — Patient Instructions (Signed)
Stroke Prevention Some medical conditions and behaviors are associated with an increased chance of having a stroke. You may prevent a stroke by making healthy choices and managing medical conditions. HOW CAN I REDUCE MY RISK OF HAVING A STROKE?   Stay physically active. Get at least 30 minutes of activity on most or all days.  Do not smoke. It may also be helpful to avoid exposure to secondhand smoke.  Limit alcohol use. Moderate alcohol use is considered to be:  No more than 2 drinks per day for men.  No more than 1 drink per day for nonpregnant women.  Eat healthy foods. This involves:  Eating 5 or more servings of fruits and vegetables a day.  Making dietary changes that address high blood pressure (hypertension), high cholesterol, diabetes, or obesity.  Manage your cholesterol levels.  Making food choices that are high in fiber and low in saturated fat, trans fat, and cholesterol may control cholesterol levels.  Take any prescribed medicines to control cholesterol as directed by your health care provider.  Manage your diabetes.  Controlling your carbohydrate and sugar intake is recommended to manage diabetes.  Take any prescribed medicines to control diabetes as directed by your health care provider.  Control your hypertension.  Making food choices that are low in salt (sodium), saturated fat, trans fat, and cholesterol is recommended to manage hypertension.  Take any prescribed medicines to control hypertension as directed by your health care provider.  Maintain a healthy weight.  Reducing calorie intake and making food choices that are low in sodium, saturated fat, trans fat, and cholesterol are recommended to manage weight.  Stop drug abuse.  Avoid taking birth control pills.  Talk to your health care provider about the risks of taking birth control pills if you are over 35 years old, smoke, get migraines, or have ever had a blood clot.  Get evaluated for sleep  disorders (sleep apnea).  Talk to your health care provider about getting a sleep evaluation if you snore a lot or have excessive sleepiness.  Take medicines only as directed by your health care provider.  For some people, aspirin or blood thinners (anticoagulants) are helpful in reducing the risk of forming abnormal blood clots that can lead to stroke. If you have the irregular heart rhythm of atrial fibrillation, you should be on a blood thinner unless there is a good reason you cannot take them.  Understand all your medicine instructions.  Make sure that other conditions (such as anemia or atherosclerosis) are addressed. SEEK IMMEDIATE MEDICAL CARE IF:   You have sudden weakness or numbness of the face, arm, or leg, especially on one side of the body.  Your face or eyelid droops to one side.  You have sudden confusion.  You have trouble speaking (aphasia) or understanding.  You have sudden trouble seeing in one or both eyes.  You have sudden trouble walking.  You have dizziness.  You have a loss of balance or coordination.  You have a sudden, severe headache with no known cause.  You have new chest pain or an irregular heartbeat. Any of these symptoms may represent a serious problem that is an emergency. Do not wait to see if the symptoms will go away. Get medical help at once. Call your local emergency services (911 in [place] Do not drive yourself to the hospital. Document Released: 04/16/2004 Document Revised: 07/24/2013 Document Reviewed: 09/09/2012 Exit[employer] Patient Information 2015 [employer]. This information is not intended to replace advice given   to you by your health care provider. Make sure you discuss any questions you have with your health care provider.  

## 2014-03-07 NOTE — Addendum Note (Signed)
Addended by: Mena Goes on: 03/07/2014 05:12 PM   Modules accepted: Orders

## 2014-05-02 ENCOUNTER — Other Ambulatory Visit: Payer: Self-pay

## 2014-05-02 MED ORDER — SIMVASTATIN 40 MG PO TABS: 40.0000 mg | ORAL_TABLET | Freq: Every evening | ORAL | Status: DC

## 2014-07-10 ENCOUNTER — Other Ambulatory Visit: Payer: Self-pay | Admitting: Interventional Cardiology

## 2014-07-10 NOTE — Telephone Encounter (Signed)
Patient is prn follow up with Dr Irish Lack, and on last ov he documented that hyperlipidemia is followed by PMD. Should this be deferred to pcp or ok to refill? Please advise. Thanks, MI

## 2014-07-10 NOTE — Telephone Encounter (Signed)
Yes, please defer to PCP.

## 2014-07-16 ENCOUNTER — Other Ambulatory Visit: Payer: Self-pay | Admitting: Family Medicine

## 2014-07-26 ENCOUNTER — Telehealth: Payer: Self-pay

## 2014-07-26 MED ORDER — SIMVASTATIN 40 MG PO TABS: 40.0000 mg | ORAL_TABLET | Freq: Every evening | ORAL | Status: DC

## 2014-07-26 NOTE — Telephone Encounter (Signed)
refill 

## 2015-03-08 ENCOUNTER — Encounter: Payer: Self-pay | Admitting: Family

## 2015-03-13 ENCOUNTER — Encounter: Payer: Self-pay | Admitting: Family

## 2015-03-13 ENCOUNTER — Ambulatory Visit (INDEPENDENT_AMBULATORY_CARE_PROVIDER_SITE_OTHER): Payer: Medicare Other | Admitting: Family

## 2015-03-13 ENCOUNTER — Ambulatory Visit (HOSPITAL_COMMUNITY)
Admission: RE | Admit: 2015-03-13 | Discharge: 2015-03-13 | Disposition: A | Discharge status: Home / Self Care | Payer: Medicare Other | Source: Ambulatory Visit | Attending: Family | Admitting: Family

## 2015-03-13 VITALS — BP 113/69 | HR 74 | Ht 68.0 in | Wt 144.0 lb

## 2015-03-13 DIAGNOSIS — Z87891 Personal history of nicotine dependence: Secondary | ICD-10-CM | POA: Diagnosis not present

## 2015-03-13 DIAGNOSIS — I6523 Occlusion and stenosis of bilateral carotid arteries: Secondary | ICD-10-CM

## 2015-03-13 DIAGNOSIS — Z48812 Encounter for surgical aftercare following surgery on the circulatory system: Secondary | ICD-10-CM

## 2015-03-13 DIAGNOSIS — Z9889 Other specified postprocedural states: Secondary | ICD-10-CM

## 2015-03-13 NOTE — Progress Notes (Signed)
Chief Complaint: Extracranial Carotid Artery Stenosis   History of Present Illness  [place] [name] is a 64 y.o. male patient of Dr. Scot Dock who is status post right CEA in 2011. The patient returns today for follow up.  The patient denies any history of TIA or stroke symptoms, specifically the patient denies a history of amaurosis fugax or monocular blindness, denies a history unilateral or facial drooping, denies a history of hemiplegia, and denies a history of receptive or expressive aphasia.   The pt denies claudication symptoms with walking, denies non healing wounds.   The patient denies New Medical or Surgical History.  Pt Diabetic: No Pt smoker: former smoker, quit in 2007  Pt meds include: Statin : Yes ASA: Yes Other anticoagulants/antiplatelets: no    Past Medical History  Diagnosis Date  . Carotid artery occlusion   . Hyperlipidemia   . Arthritis     Social History Social History  Substance Use Topics  . Smoking status: Former Smoker    Types: Cigarettes    Quit date: 06/21/2005  . Smokeless tobacco: Never Used  . Alcohol Use: Yes    Family History Family History  Problem Relation Age of Onset  . Diabetes Brother     Surgical History Past Surgical History  Procedure Laterality Date  . Hernia repair    . Carotid endarterectomy  July 02, 2009    Right cea  . Eye surgery  May  2011    Bilateral cataract    No Known Allergies  Current Outpatient Prescriptions  Medication Sig Dispense Refill  . aspirin 81 MG tablet Take 81 mg by mouth daily.    . clonazePAM (KLONOPIN) 0.5 MG tablet Take 0.5 mg by mouth 2 (two) times daily as needed for anxiety.    Marland Kitchen imipramine (TOFRANIL) 25 MG tablet Take 25 mg by mouth as needed.     . Multiple Vitamin (MULTIVITAMIN) tablet Take 1 tablet by mouth daily.    . simvastatin (ZOCOR) 40 MG tablet Take 1 tablet (40 mg total) by mouth every evening. 90 tablet 1   No current facility-administered medications  for this visit.    Review of Systems : See HPI for pertinent positives and negatives.  Physical Examination  Filed Vitals:   03/13/15 1041 03/13/15 1043  BP: 112/66 113/69  Pulse: 74   Height: '5\' 8"'$  (1.727 m)   Weight: 144 lb (65.318 kg)   SpO2: 96%    Body mass index is 21.9 kg/(m^2).   General: WDWN male in NAD GAIT: normal Eyes: PERRLA Pulmonary: Non-labored, CTAB, no rales,  rhonchi, or wheezing.  Cardiac: regular rhythm,no detected murmur.  VASCULAR EXAM Carotid Bruits Right Left   Negative Negative    Radial pulses are 2+ palpable and equal.      LE Pulses Right Left   POPLITEAL not palpable  not palpable   POSTERIOR TIBIAL  palpable   palpable    DORSALIS PEDIS  ANTERIOR TIBIAL not palpable  not palpable     Gastrointestinal: soft, nontender, BS WNL, no r/g, no palpated masses.  Musculoskeletal: No muscle atrophy/wasting. M/S 5/5 throughout, Extremities without ischemic changes.  Neurologic: A&O X 3; Appropriate Affect, Speech is normal CN 2-12 intact, Pain and light touch intact in extremities, Motor exam as listed above.         Non-Invasive Vascular Imaging CAROTID DUPLEX 03/13/2015   Right ICA: patent CEA site, no restenosis. Left ICA: 1-39 stenosis. No significant change compared to 03/07/14.  Assessment: [place] [name] is a 64 y.o. male who is status post right CEA in 2011. He has no hx of stroke or TIA. Today's carotid duplex suggests a patent right CEA site, no restenosis. Left ICA: 1-39 stenosis. No significant change compared to 03/07/14.   Plan: Follow-up in 2 years with Carotid Duplex scan.  I discussed in depth with the patient the nature of atherosclerosis, and emphasized the importance of maximal medical management including strict control of  blood pressure, blood glucose, and lipid levels, obtaining regular exercise, and continued cessation of smoking.  The patient is aware that without maximal medical management the underlying atherosclerotic disease process will progress, limiting the benefit of any interventions. The patient was given information about stroke prevention and what symptoms should prompt the patient to seek immediate medical care. Thank you for allowing [place] to participate in this patient's care.  Clemon Chambers, RN, MSN, FNP-C [facility] Office: [phone]  Clinic Physician: Scot Dock  03/13/2015 10:38 AM

## 2015-03-13 NOTE — Patient Instructions (Signed)
Stroke Prevention Some medical conditions and behaviors are associated with an increased chance of having a stroke. You may prevent a stroke by making healthy choices and managing medical conditions. HOW CAN I REDUCE MY RISK OF HAVING A STROKE?   Stay physically active. Get at least 30 minutes of activity on most or all days.  Do not smoke. It may also be helpful to avoid exposure to secondhand smoke.  Limit alcohol use. Moderate alcohol use is considered to be:  No more than 2 drinks per day for men.  No more than 1 drink per day for nonpregnant women.  Eat healthy foods. This involves:  Eating 5 or more servings of fruits and vegetables a day.  Making dietary changes that address high blood pressure (hypertension), high cholesterol, diabetes, or obesity.  Manage your cholesterol levels.  Making food choices that are high in fiber and low in saturated fat, trans fat, and cholesterol may control cholesterol levels.  Take any prescribed medicines to control cholesterol as directed by your health care provider.  Manage your diabetes.  Controlling your carbohydrate and sugar intake is recommended to manage diabetes.  Take any prescribed medicines to control diabetes as directed by your health care provider.  Control your hypertension.  Making food choices that are low in salt (sodium), saturated fat, trans fat, and cholesterol is recommended to manage hypertension.  Ask your health care provider if you need treatment to lower your blood pressure. Take any prescribed medicines to control hypertension as directed by your health care provider.  If you are 18-39 years of age, have your blood pressure checked every 3-5 years. If you are 40 years of age or older, have your blood pressure checked every year.  Maintain a healthy weight.  Reducing calorie intake and making food choices that are low in sodium, saturated fat, trans fat, and cholesterol are recommended to manage  weight.  Stop drug abuse.  Avoid taking birth control pills.  Talk to your health care provider about the risks of taking birth control pills if you are over 35 years old, smoke, get migraines, or have ever had a blood clot.  Get evaluated for sleep disorders (sleep apnea).  Talk to your health care provider about getting a sleep evaluation if you snore a lot or have excessive sleepiness.  Take medicines only as directed by your health care provider.  For some people, aspirin or blood thinners (anticoagulants) are helpful in reducing the risk of forming abnormal blood clots that can lead to stroke. If you have the irregular heart rhythm of atrial fibrillation, you should be on a blood thinner unless there is a good reason you cannot take them.  Understand all your medicine instructions.  Make sure that other conditions (such as anemia or atherosclerosis) are addressed. SEEK IMMEDIATE MEDICAL CARE IF:   You have sudden weakness or numbness of the face, arm, or leg, especially on one side of the body.  Your face or eyelid droops to one side.  You have sudden confusion.  You have trouble speaking (aphasia) or understanding.  You have sudden trouble seeing in one or both eyes.  You have sudden trouble walking.  You have dizziness.  You have a loss of balance or coordination.  You have a sudden, severe headache with no known cause.  You have new chest pain or an irregular heartbeat. Any of these symptoms may represent a serious problem that is an emergency. Do not wait to see if the symptoms will   go away. Get medical help at once. Call your local emergency services (911 in [place] Do not drive yourself to the hospital.   This information is not intended to replace advice given to you by your health care provider. Make sure you discuss any questions you have with your health care provider.   Document Released: 04/16/2004 Document Revised: 03/30/2014 Document Reviewed:  09/09/2012 Elsevier Interactive Patient Education 2016 Else[employer] Inc.  

## 2015-08-02 ENCOUNTER — Other Ambulatory Visit: Payer: Self-pay | Admitting: Family Medicine

## 2017-03-10 ENCOUNTER — Other Ambulatory Visit: Payer: Self-pay

## 2017-03-10 DIAGNOSIS — I6529 Occlusion and stenosis of unspecified carotid artery: Secondary | ICD-10-CM

## 2017-03-17 ENCOUNTER — Encounter (HOSPITAL_COMMUNITY): Payer: Medicare Other

## 2017-03-17 ENCOUNTER — Ambulatory Visit: Payer: Medicare Other | Admitting: Family

## 2017-04-09 ENCOUNTER — Encounter (HOSPITAL_COMMUNITY): Payer: Medicare Other

## 2017-04-09 ENCOUNTER — Ambulatory Visit: Payer: Medicare Other | Admitting: Family

## 2017-06-01 ENCOUNTER — Other Ambulatory Visit: Payer: Self-pay

## 2017-06-01 ENCOUNTER — Encounter: Payer: Self-pay | Admitting: Family

## 2017-06-01 ENCOUNTER — Ambulatory Visit (HOSPITAL_COMMUNITY)
Admission: RE | Admit: 2017-06-01 | Discharge: 2017-06-01 | Disposition: A | Discharge status: Home / Self Care | Payer: Medicare Other | Source: Ambulatory Visit | Attending: Family | Admitting: Family

## 2017-06-01 ENCOUNTER — Ambulatory Visit: Payer: Medicare Other | Admitting: Family

## 2017-06-01 VITALS — BP 105/63 | HR 68 | Temp 97.0°F | Resp 16 | Ht 67.0 in | Wt 132.0 lb

## 2017-06-01 DIAGNOSIS — I6529 Occlusion and stenosis of unspecified carotid artery: Secondary | ICD-10-CM

## 2017-06-01 DIAGNOSIS — Z9889 Other specified postprocedural states: Secondary | ICD-10-CM

## 2017-06-01 DIAGNOSIS — I6523 Occlusion and stenosis of bilateral carotid arteries: Secondary | ICD-10-CM

## 2017-06-01 NOTE — Patient Instructions (Signed)

## 2017-06-01 NOTE — Progress Notes (Signed)
Chief Complaint: Follow up Extracranial Carotid Artery Stenosis   History of Present Illness  Alex [name] is a 67 y.o. male who is status post right CEA in 2011 by Dr. Scot Dock. The patient returns today for follow up.  The patient denies any history of TIA or stroke symptoms, specifically he denies a history of amaurosis fugax or monocular blindness, unilateral facial drooping, hemiplegia, or receptive or expressive aphasia.   He denies claudication symptoms with walking, denies non healing wounds.   Pt Diabetic: No Pt smoker: former smoker, quit in 2007  Pt meds include: Statin : Yes ASA: Yes Other anticoagulants/antiplatelets: no   Past Medical History:  Diagnosis Date  . Arthritis   . Carotid artery occlusion   . Hyperlipidemia     Social History Social History   Tobacco Use  . Smoking status: Former Smoker    Types: Cigarettes    Last attempt to quit: 06/21/2005    Years since quitting: 11.9  . Smokeless tobacco: Never Used  Substance Use Topics  . Alcohol use: Yes  . Drug use: Yes    Family History Family History  Problem Relation Age of Onset  . Diabetes Brother     Surgical History Past Surgical History:  Procedure Laterality Date  . CAROTID ENDARTERECTOMY  July 02, 2009   Right cea  . EYE SURGERY  May  2011   Bilateral cataract  . HERNIA REPAIR      No Known Allergies  Current Outpatient Medications  Medication Sig Dispense Refill  . aspirin 81 MG tablet Take 81 mg by mouth daily.    . clonazePAM (KLONOPIN) 0.5 MG tablet Take 0.5 mg by mouth 2 (two) times daily as needed for anxiety.    . Multiple Vitamin (MULTIVITAMIN) tablet Take 1 tablet by mouth daily.    . simvastatin (ZOCOR) 40 MG tablet Take 1 tablet (40 mg total) by mouth every evening. 90 tablet 1  . imipramine (TOFRANIL) 25 MG tablet Take 25 mg by mouth as needed.      No current facility-administered medications for this visit.     Review of Systems : See HPI for  pertinent positives and negatives.  Physical Examination  Vitals:   06/01/17 1233 06/01/17 1238  BP: 98/62 105/63  Pulse: 68 68  Resp: 16   Temp: (!) 97 F (36.1 C)   TempSrc: Oral   SpO2: 95%   Weight: 132 lb (59.9 kg)   Height: 5\' 7"  (1.702 m)    Body mass index is 20.67 kg/m.  General:WDWN male in NAD GAIT: normal HENT: full facial beard Eyes: PERRLA Pulmonary: Respirations are non-labored, CTAB, no rales,  rhonchi, or wheezing. Cardiac: regular rhythm,no detected murmur.  VASCULAR EXAM Carotid Bruits Right Left   Negative Negative    Radial pulses are 2+ palpable and equal. Abdominal aortic pulse is not palpable       LE Pulses Right Left   POPLITEAL  palpable   palpable   POSTERIOR TIBIAL  palpable   palpable    DORSALIS PEDIS  ANTERIOR TIBIAL not palpable  not palpable     Gastrointestinal: soft, nontender, BS WNL, no r/g, no palpated masses. Musculoskeletal: No muscle atrophy/wasting. M/S 5/5 throughout, Extremities without ischemic changes. Skin: No rashes, no ulcers, no cellulitis.   Neurologic:  A&O X 3; appropriate affect, sensation is normal; speech is normal, CN 2-12 intact, pain and light touch intact in extremities, motor exam as listed above. Psychiatric: Normal thought content, mood appropriate  to clinical situation.       Assessment: Alex [name] is a 67 y.o. male who is status post right CEA in 2011. He has no hx of stroke or TIA.  DATA Carotid Duplex (06/01/17): Patent right CEA site, no restenosis. Left ICA: 1-39 stenosis. Bilateral vertebral artery flow is antegrade.  Bilateral subclavian artery waveforms are normal.  No significant change compared to the exams on 03/07/14 and 03-13-15.    Plan:  Follow-up in 2 years with Carotid Duplex  scan.  I discussed in depth with the patient the nature of atherosclerosis, and emphasized the importance of maximal medical management including strict control of blood pressure, blood glucose, and lipid levels, obtaining regular exercise, and continued cessation of smoking.  The patient is aware that without maximal medical management the underlying atherosclerotic disease process will progress, limiting the benefit of any interventions. The patient was given information about stroke prevention and what symptoms should prompt the patient to seek immediate medical care. Thank you for allowing [place] to participate in this patient's care.  Clemon Chambers, RN, MSN, FNP-C [facility] Office: [phone]  Clinic Physician: Early  06/01/17 1:49 PM

## 2017-11-29 ENCOUNTER — Other Ambulatory Visit: Payer: Self-pay | Admitting: Physician Assistant

## 2017-11-29 ENCOUNTER — Ambulatory Visit
Admission: RE | Admit: 2017-11-29 | Discharge: 2017-11-29 | Disposition: A | Discharge status: Home / Self Care | Payer: Medicare Other | Source: Ambulatory Visit | Attending: Physician Assistant | Admitting: Physician Assistant

## 2017-11-29 DIAGNOSIS — R059 Cough, unspecified: Secondary | ICD-10-CM

## 2017-11-29 DIAGNOSIS — R05 Cough: Secondary | ICD-10-CM

## 2017-11-29 DIAGNOSIS — R634 Abnormal weight loss: Secondary | ICD-10-CM

## 2019-07-10 ENCOUNTER — Telehealth: Payer: Self-pay | Admitting: Interventional Cardiology

## 2019-07-10 NOTE — Telephone Encounter (Signed)
Follow Up  Patient returning call. Gave number to [facility] to schedule.

## 2019-07-10 NOTE — Telephone Encounter (Signed)
Left message to call back   The patient was to follow up with [facility] AS NEEDED. Patient's carotid US was ordered by [facility] [phone]). They are the ones that recommended that it be repeated in 2 years. He should call them to arrange.

## 2019-07-10 NOTE — Telephone Encounter (Signed)
Alex [name] is calling to schedule his 2 year f/u Vascular  Carotid test, but there is no active request in the system. Please advise.

## 2019-07-13 ENCOUNTER — Other Ambulatory Visit: Payer: Self-pay | Admitting: *Deleted

## 2019-07-13 DIAGNOSIS — I6529 Occlusion and stenosis of unspecified carotid artery: Secondary | ICD-10-CM

## 2019-07-20 ENCOUNTER — Other Ambulatory Visit: Payer: Self-pay

## 2019-07-20 ENCOUNTER — Ambulatory Visit (HOSPITAL_COMMUNITY)
Admission: RE | Admit: 2019-07-20 | Discharge: 2019-07-20 | Disposition: A | Discharge status: Home / Self Care | Payer: Medicare Other | Source: Ambulatory Visit | Attending: Vascular Surgery | Admitting: Vascular Surgery

## 2019-07-20 ENCOUNTER — Ambulatory Visit: Payer: Medicare Other | Admitting: Physician Assistant

## 2019-07-20 VITALS — BP 115/74 | HR 67 | Resp 16 | Ht 68.0 in | Wt 135.0 lb

## 2019-07-20 DIAGNOSIS — I6523 Occlusion and stenosis of bilateral carotid arteries: Secondary | ICD-10-CM

## 2019-07-20 DIAGNOSIS — Z9889 Other specified postprocedural states: Secondary | ICD-10-CM | POA: Diagnosis not present

## 2019-07-20 DIAGNOSIS — I6529 Occlusion and stenosis of unspecified carotid artery: Secondary | ICD-10-CM | POA: Insufficient documentation

## 2019-07-20 NOTE — Progress Notes (Signed)
Office Note     CC:  follow up Requesting Provider:  Alroy Dust, L.Marlou Sa, MD  HPI: Alex [name] is a 69 y.o. (04-13-1950) male who presents for follow up of carotid stenosis. He is status post right CEA in 2011 by Dr. Scot Dock. He denies any new neurological symptoms. He denies any changes in speech, facial numbness or drooping, changes in vision, upper or lower extremity weakness   He denies any lower extremity symptoms of claudication, rest pain or non healing wounds. He does not have any lower extremity swelling  The pt is on a statin for cholesterol management.  The pt is not on a daily aspirin. Per PCP this was discontinued. Other AC:  none The pt is not on medication for hypertension.   The pt is not diabetic.  Tobacco hx: Former, quit 2007  Past Medical History:  Diagnosis Date  . Arthritis   . Carotid artery occlusion   . Hyperlipidemia     Past Surgical History:  Procedure Laterality Date  . CAROTID ENDARTERECTOMY  July 02, 2009   Right cea  . EYE SURGERY  May  2011   Bilateral cataract  . HERNIA REPAIR      Social History   Socioeconomic History  . Marital status: Married    Spouse name: Not on file  . Number of children: Not on file  . Years of education: Not on file  . Highest education level: Not on file  Occupational History  . Not on file  Tobacco Use  . Smoking status: Former Smoker    Types: Cigarettes    Quit date: 06/21/2005    Years since quitting: 14.0  . Smokeless tobacco: Never Used  Substance and Sexual Activity  . Alcohol use: Yes  . Drug use: Yes  . Sexual activity: Not on file  Other Topics Concern  . Not on file  Social History Narrative  . Not on file   Social Determinants of Health   Financial Resource Strain:   . Difficulty of Paying Living Expenses:   Food Insecurity:   . Worried About Charity fundraiser in the Last Year:   . Arboriculturist in the Last Year:   Transportation Needs:   . Film/video editor  (Medical):   Marland Kitchen Lack of Transportation (Non-Medical):   Physical Activity:   . Days of Exercise per Week:   . Minutes of Exercise per Session:   Stress:   . Feeling of Stress :   Social Connections:   . Frequency of Communication with Friends and Family:   . Frequency of Social Gatherings with Friends and Family:   . Attends Religious Services:   . Active Member of Clubs or Organizations:   . Attends Archivist Meetings:   Marland Kitchen Marital Status:   Intimate Partner Violence:   . Fear of Current or Ex-Partner:   . Emotionally Abused:   Marland Kitchen Physically Abused:   . Sexually Abused:     Family History  Problem Relation Age of Onset  . Diabetes Brother     Current Outpatient Medications  Medication Sig Dispense Refill  . clonazePAM (KLONOPIN) 0.5 MG tablet Take 0.5 mg by mouth 2 (two) times daily as needed for anxiety.    . Multiple Vitamin (MULTIVITAMIN) tablet Take 1 tablet by mouth daily.    . simvastatin (ZOCOR) 40 MG tablet Take 1 tablet (40 mg total) by mouth every evening. 90 tablet 1   No current facility-administered medications for this  visit.    No Known Allergies   REVIEW OF SYSTEMS:  Review of Systems  Constitutional: Negative for chills, fever and malaise/fatigue.  HENT: Negative for sore throat.   Eyes: Negative for double vision.  Respiratory: Negative for cough and shortness of breath.   Cardiovascular: Negative for chest pain, palpitations, claudication and leg swelling.  Gastrointestinal: Negative for abdominal pain, constipation, diarrhea, nausea and vomiting.  Genitourinary: Negative for dysuria.  Musculoskeletal: Negative for myalgias.  Neurological: Negative for dizziness, weakness and headaches.  Endo/Heme/Allergies: Does not bruise/bleed easily.    PHYSICAL EXAMINATION:  Vitals:   07/20/19 0819 07/20/19 0822  BP: 97/64 115/74  Pulse: 63 67  Resp: 16   SpO2: 96%   Weight: 135 lb (61.2 kg)   Height: 5\' 8"  (1.727 m)     General:  WDWN  in NAD; vital signs documented above Gait: Normal HENT: WNL, normocephalic Pulmonary: normal non-labored breathing , without Rales, rhonchi,  wheezing Cardiac: regular HR, without  Murmurs without carotid bruit Abdomen: soft, NT, no masses Skin: without rashes Vascular Exam/Pulses:  Right Left  Radial 2+ (normal) 2+ (normal)  Ulnar 2+ (normal) 2+ (normal)  Femoral 2+ (normal) 2+ (normal)  Popliteal Not palpable Not palpable  DP 1+ (weak)  1+ (weak)  PT 2+ (normal) 2+ (normal)   Extremities: without ischemic changes, without Gangrene , without cellulitis; without open wounds;  Musculoskeletal: no muscle wasting or atrophy  Neurologic: A&O X 3;  No focal weakness or paresthesias are detected Psychiatric:  The pt has Normal affect.   Non-Invasive Vascular Imaging:   VAS US Carotid 07/20/19 Right Carotid: Velocities in the right ICA are consistent with a 1-39% stenosis.   Left Carotid: Velocities in the left ICA are consistent with a 1-39% stenosis.   Vertebrals: Bilateral vertebral arteries demonstrate antegrade flow.  Subclavians: Normal flow hemodynamics were seen in bilateral subclavian arteries.    ASSESSMENT/PLAN:: 69 y.o. male here for follow up for carotid stenosis. He is status pose right CEA in 2011 by Dr. Scot Dock. He is asymptomatic. Carotid duplex today shows 1-39% bilaterally with no recurrent right ICA stenosis. - he will continue his Statin medication - he was advised to follow up earlier or present immediately to the emergency room if he develops any new TIA/ stoke like symptoms - he will follow up in 2 years with carotid duplex   Karoline Caldwell, PA-C [facility] [phone]  Clinic MD:   Dr. Scot Dock

## 2019-07-24 ENCOUNTER — Other Ambulatory Visit: Payer: Self-pay | Admitting: *Deleted

## 2019-07-24 DIAGNOSIS — Z9889 Other specified postprocedural states: Secondary | ICD-10-CM

## 2020-09-19 DIAGNOSIS — H1131 Conjunctival hemorrhage, right eye: Secondary | ICD-10-CM | POA: Diagnosis not present

## 2021-01-03 DIAGNOSIS — R059 Cough, unspecified: Secondary | ICD-10-CM | POA: Diagnosis not present

## 2021-01-03 DIAGNOSIS — Z23 Encounter for immunization: Secondary | ICD-10-CM | POA: Diagnosis not present

## 2021-01-03 DIAGNOSIS — I700 Atherosclerosis of aorta: Secondary | ICD-10-CM | POA: Diagnosis not present

## 2021-01-03 DIAGNOSIS — K219 Gastro-esophageal reflux disease without esophagitis: Secondary | ICD-10-CM | POA: Diagnosis not present

## 2021-01-03 DIAGNOSIS — Z0000 Encounter for general adult medical examination without abnormal findings: Secondary | ICD-10-CM | POA: Diagnosis not present

## 2021-01-03 DIAGNOSIS — R079 Chest pain, unspecified: Secondary | ICD-10-CM | POA: Diagnosis not present

## 2021-01-03 DIAGNOSIS — E782 Mixed hyperlipidemia: Secondary | ICD-10-CM | POA: Diagnosis not present

## 2021-01-03 DIAGNOSIS — M25551 Pain in right hip: Secondary | ICD-10-CM | POA: Diagnosis not present

## 2021-02-23 NOTE — Progress Notes (Signed)
Cardiology Office Note   Date:  02/25/2021   ID:  Alex [name], DOB 1950/12/10, MRN [id number]  PCP:  Alroy Dust, L.Marlou Sa, MD    No chief complaint on file.  PAD, chest discomfort  Wt Readings from Last 3 Encounters:  02/25/21 131 lb 9.6 oz (59.7 kg)  07/20/19 135 lb (61.2 kg)  06/01/17 132 lb (59.9 kg)       History of Present Illness: Alex [name] is a 70 y.o. male   who has had a Right CEA in 2011. I last saw him in 2015.  A that time: "He walks and hikes. He feels SHOB with walking up stairs and steep hills. It goes away after a few minutes. No hard chest pain. No change in that symptom. Negative stress test in 12/14."    Exertional chest discomfort with walking trails, up inclines and even with taking the trash can to the end of the driveway.  He has still tried to maintain the level of exertion along with continue walking.  Denies :  Dizziness. Leg edema. Nitroglycerin use. Orthopnea. Palpitations. Paroxysmal nocturnal dyspnea. Shortness of breath. Syncope.    Quit smoking many years ago.    Past Medical History:  Diagnosis Date   Allergic rhinitis    Anxiety    Aortic atherosclerosis (HCC)    Arthritis    Carotid artery occlusion    Cough    GERD (gastroesophageal reflux disease)    Hip pain    Hyperlipidemia     Past Surgical History:  Procedure Laterality Date   CAROTID ENDARTERECTOMY  July 02, 2009   Right cea   EYE SURGERY  May  2011   Bilateral cataract   HERNIA REPAIR       Current Outpatient Medications  Medication Sig Dispense Refill   acetaminophen (TYLENOL) 500 MG tablet Take 500 mg by mouth every 6 (six) hours as needed.     albuterol (VENTOLIN HFA) 108 (90 Base) MCG/ACT inhaler Inhale into the lungs every 6 (six) hours as needed for wheezing or shortness of breath.     clonazePAM (KLONOPIN) 0.5 MG tablet Take 0.5 mg by mouth 2 (two) times daily as needed for anxiety.     guaiFENesin-codeine (ROBITUSSIN AC) 100-10 MG/5ML syrup  Take 5 mLs by mouth 3 (three) times daily as needed for cough.     ibuprofen (ADVIL) 200 MG tablet Take 200 mg by mouth every 6 (six) hours as needed.     omeprazole (PRILOSEC) 20 MG capsule Take 20 mg by mouth daily.     simvastatin (ZOCOR) 40 MG tablet Take 1 tablet (40 mg total) by mouth every evening. 90 tablet 1   Multiple Vitamin (MULTIVITAMIN) tablet Take 1 tablet by mouth daily. (Patient not taking: Reported on 02/25/2021)     No current facility-administered medications for this visit.    Allergies:   Patient has no known allergies.    Social History:  The patient  reports that he quit smoking about 15 years ago. His smoking use included cigarettes. He has never used smokeless tobacco. He reports current alcohol use. He reports current drug use.   Family History:  The patient's family history includes Diabetes in his brother.    ROS:  Please see the history of present illness.   Otherwise, review of systems are positive for angina.   All other systems are reviewed and negative.    PHYSICAL EXAM: VS:  BP 126/80   Pulse 62   Ht 5'  7" (1.702 m)   Wt 131 lb 9.6 oz (59.7 kg)   SpO2 97%   BMI 20.61 kg/m  , BMI Body mass index is 20.61 kg/m. GEN: Well nourished, well developed, in no acute distress HEENT: normal Neck: no JVD, carotid bruits, or masses Cardiac: RRR; no murmurs, rubs, or gallops,no edema  Respiratory:  clear to auscultation bilaterally, normal work of breathing GI: soft, nontender, nondistended, + BS MS: no deformity or atrophy Skin: warm and dry, no rash Neuro:  Strength and sensation are intact Psych: euthymic mood, full affect   EKG:   The ekg ordered today demonstrates NSR, no ST changes   Recent Labs: No results found for requested labs within last 8760 hours.   Lipid Panel No results found for: CHOL, TRIG, HDL, CHOLHDL, VLDL, LDLCALC, LDLDIRECT   Other studies Reviewed: Additional studies/ records that were reviewed today with results  demonstrating: labs reviewed.   ASSESSMENT AND PLAN:  Chest pain: He has several risk factors for heart disease including known PAD.  Classic anginal sx.  We discussed cath and he is agreeable.  Decrease any activity and need to stop when symptoms occur.  Symptoms worse with inclines, definitely avoid inclines until the catheterization.  Rx for SL NTG. PAD: Status post carotid endarterectomy.  Healthy lifestyle changes including whole food, high-fiber, plant-based diet discussed. Hyperlipidemia: Continue Zocor.  LDL 70 in 2022.  The patient understands that risks include but are not limited to stroke (1 in 1000), death (1 in 35), kidney failure [usually temporary] (1 in 500), bleeding (1 in 200), allergic reaction [possibly serious] (1 in 200), and agrees to proceed.     Current medicines are reviewed at length with the patient today.  The patient concerns regarding his medicines were addressed.  The following changes have been made:  add SL NTG  Labs/ tests ordered today include:  No orders of the defined types were placed in this encounter.   Recommend 150 minutes/week of aerobic exercise Low fat, low carb, high fiber diet recommended  Disposition:   FU for cath   Signed, [name], MD  02/25/2021 10:11 AM    [facility] [place], [place], [place]  [address] Phone: [phone]; Fax: [phone]

## 2021-02-23 NOTE — H&P (View-Only) (Signed)
Cardiology Office Note   Date:  02/25/2021   ID:  Alex [name], DOB 1950-06-21, MRN [id number]  PCP:  Alroy Dust, L.Marlou Sa, MD    No chief complaint on file.  PAD, chest discomfort  Wt Readings from Last 3 Encounters:  02/25/21 131 lb 9.6 oz (59.7 kg)  07/20/19 135 lb (61.2 kg)  06/01/17 132 lb (59.9 kg)       History of Present Illness: Alex [name] is a 70 y.o. male   who has had a Right CEA in 2011. I last saw him in 2015.  A that time: "He walks and hikes. He feels SHOB with walking up stairs and steep hills. It goes away after a few minutes. No hard chest pain. No change in that symptom. Negative stress test in 12/14."    Exertional chest discomfort with walking trails, up inclines and even with taking the trash can to the end of the driveway.  He has still tried to maintain the level of exertion along with continue walking.  Denies :  Dizziness. Leg edema. Nitroglycerin use. Orthopnea. Palpitations. Paroxysmal nocturnal dyspnea. Shortness of breath. Syncope.    Quit smoking many years ago.    Past Medical History:  Diagnosis Date   Allergic rhinitis    Anxiety    Aortic atherosclerosis (HCC)    Arthritis    Carotid artery occlusion    Cough    GERD (gastroesophageal reflux disease)    Hip pain    Hyperlipidemia     Past Surgical History:  Procedure Laterality Date   CAROTID ENDARTERECTOMY  July 02, 2009   Right cea   EYE SURGERY  May  2011   Bilateral cataract   HERNIA REPAIR       Current Outpatient Medications  Medication Sig Dispense Refill   acetaminophen (TYLENOL) 500 MG tablet Take 500 mg by mouth every 6 (six) hours as needed.     albuterol (VENTOLIN HFA) 108 (90 Base) MCG/ACT inhaler Inhale into the lungs every 6 (six) hours as needed for wheezing or shortness of breath.     clonazePAM (KLONOPIN) 0.5 MG tablet Take 0.5 mg by mouth 2 (two) times daily as needed for anxiety.     guaiFENesin-codeine (ROBITUSSIN AC) 100-10 MG/5ML syrup  Take 5 mLs by mouth 3 (three) times daily as needed for cough.     ibuprofen (ADVIL) 200 MG tablet Take 200 mg by mouth every 6 (six) hours as needed.     omeprazole (PRILOSEC) 20 MG capsule Take 20 mg by mouth daily.     simvastatin (ZOCOR) 40 MG tablet Take 1 tablet (40 mg total) by mouth every evening. 90 tablet 1   Multiple Vitamin (MULTIVITAMIN) tablet Take 1 tablet by mouth daily. (Patient not taking: Reported on 02/25/2021)     No current facility-administered medications for this visit.    Allergies:   Patient has no known allergies.    Social History:  The patient  reports that he quit smoking about 15 years ago. His smoking use included cigarettes. He has never used smokeless tobacco. He reports current alcohol use. He reports current drug use.   Family History:  The patient's family history includes Diabetes in his brother.    ROS:  Please see the history of present illness.   Otherwise, review of systems are positive for angina.   All other systems are reviewed and negative.    PHYSICAL EXAM: VS:  BP 126/80   Pulse 62   Ht 5'  7" (1.702 m)   Wt 131 lb 9.6 oz (59.7 kg)   SpO2 97%   BMI 20.61 kg/m  , BMI Body mass index is 20.61 kg/m. GEN: Well nourished, well developed, in no acute distress HEENT: normal Neck: no JVD, carotid bruits, or masses Cardiac: RRR; no murmurs, rubs, or gallops,no edema  Respiratory:  clear to auscultation bilaterally, normal work of breathing GI: soft, nontender, nondistended, + BS MS: no deformity or atrophy Skin: warm and dry, no rash Neuro:  Strength and sensation are intact Psych: euthymic mood, full affect   EKG:   The ekg ordered today demonstrates NSR, no ST changes   Recent Labs: No results found for requested labs within last 8760 hours.   Lipid Panel No results found for: CHOL, TRIG, HDL, CHOLHDL, VLDL, LDLCALC, LDLDIRECT   Other studies Reviewed: Additional studies/ records that were reviewed today with results  demonstrating: labs reviewed.   ASSESSMENT AND PLAN:  Chest pain: He has several risk factors for heart disease including known PAD.  Classic anginal sx.  We discussed cath and he is agreeable.  Decrease any activity and need to stop when symptoms occur.  Symptoms worse with inclines, definitely avoid inclines until the catheterization.  Rx for SL NTG. PAD: Status post carotid endarterectomy.  Healthy lifestyle changes including whole food, high-fiber, plant-based diet discussed. Hyperlipidemia: Continue Zocor.  LDL 70 in 2022.  The patient understands that risks include but are not limited to stroke (1 in 1000), death (1 in 19), kidney failure [usually temporary] (1 in 500), bleeding (1 in 200), allergic reaction [possibly serious] (1 in 200), and agrees to proceed.     Current medicines are reviewed at length with the patient today.  The patient concerns regarding his medicines were addressed.  The following changes have been made:  add SL NTG  Labs/ tests ordered today include:  No orders of the defined types were placed in this encounter.   Recommend 150 minutes/week of aerobic exercise Low fat, low carb, high fiber diet recommended  Disposition:   FU for cath   Signed, [name], MD  02/25/2021 10:11 AM    [facility] [address], [place], [place]  [address] Phone: [phone]; Fax: [phone]

## 2021-02-25 ENCOUNTER — Encounter: Payer: Self-pay | Admitting: Interventional Cardiology

## 2021-02-25 ENCOUNTER — Other Ambulatory Visit: Payer: Self-pay

## 2021-02-25 ENCOUNTER — Ambulatory Visit: Payer: Medicare Other | Admitting: Interventional Cardiology

## 2021-02-25 VITALS — BP 126/80 | HR 62 | Ht 67.0 in | Wt 131.6 lb

## 2021-02-25 DIAGNOSIS — I6529 Occlusion and stenosis of unspecified carotid artery: Secondary | ICD-10-CM | POA: Diagnosis not present

## 2021-02-25 DIAGNOSIS — Z87891 Personal history of nicotine dependence: Secondary | ICD-10-CM | POA: Diagnosis not present

## 2021-02-25 DIAGNOSIS — E782 Mixed hyperlipidemia: Secondary | ICD-10-CM

## 2021-02-25 DIAGNOSIS — I209 Angina pectoris, unspecified: Secondary | ICD-10-CM

## 2021-02-25 DIAGNOSIS — I739 Peripheral vascular disease, unspecified: Secondary | ICD-10-CM

## 2021-02-25 LAB — BASIC METABOLIC PANEL
BUN/Creatinine Ratio: 18 (ref 10–24)
BUN: 18 mg/dL (ref 8–27)
CO2: 26 mmol/L (ref 20–29)
Calcium: 9.6 mg/dL (ref 8.6–10.2)
Chloride: 103 mmol/L (ref 96–106)
Creatinine, Ser: 1.01 mg/dL (ref 0.76–1.27)
Glucose: 95 mg/dL (ref 70–99)
Potassium: 4.9 mmol/L (ref 3.5–5.2)
Sodium: 138 mmol/L (ref 134–144)
eGFR: 79 mL/min/{1.73_m2} (ref >59)

## 2021-02-25 LAB — CBC
Hematocrit: 43.3 % (ref 37.5–51.0)
Hemoglobin: 14.8 g/dL (ref 13.0–17.7)
MCH: 31.4 pg (ref 26.6–33.0)
MCHC: 34.2 g/dL (ref 31.5–35.7)
MCV: 92 fL (ref 79–97)
Platelets: 167 10*3/uL (ref 150–450)
RBC: 4.71 x10E6/uL (ref 4.14–5.80)
RDW: 13 % (ref 11.6–15.4)
WBC: 6.4 10*3/uL (ref 3.4–10.8)

## 2021-02-25 MED ORDER — NITROGLYCERIN 0.4 MG SL SUBL: 0.4000 mg | SUBLINGUAL_TABLET | SUBLINGUAL | 6 refills | Status: DC | PRN

## 2021-02-25 NOTE — Patient Instructions (Addendum)
Medication Instructions:  Your physician recommends that you continue on your current medications as directed. Please refer to the Current Medication list given to you today.  A prescription for nitroglycerin has been sent to your pharmacy to use as needed.  *If you need a refill on your cardiac medications before your next appointment, please call your pharmacy*   Lab Work: Lab work to be done today--BMP and CBC If you have labs (blood work) drawn today and your tests are completely normal, you will receive your results only by: [place] (if you have MyChart) OR A paper copy in the mail If you have any lab test that is abnormal or we need to change your treatment, we will call you to review the results.   Testing/Procedures: Your physician has requested that you have a cardiac catheterization. Cardiac catheterization is used to diagnose and/or treat various heart conditions. Doctors may recommend this procedure for a number of different reasons. The most common reason is to evaluate chest pain. Chest pain can be a symptom of coronary artery disease (CAD), and cardiac catheterization can show whether plaque is narrowing or blocking your heart's arteries. This procedure is also used to evaluate the valves, as well as measure the blood flow and oxygen levels in different parts of your heart. For further information please visit [email or web address]. Please follow instruction sheet, as given. Scheduled for 02/26/21   Follow-Up: At [facility], you and your health needs are our priority.  As part of our continuing mission to provide you with exceptional heart care, we have created designated Provider Care Teams.  These Care Teams include your primary Cardiologist (physician) and Advanced Practice Providers (APPs -  Physician Assistants and Nurse Practitioners) who all work together to provide you with the care you need, when you need it.  We recommend signing up for the patient portal  called "MyChart".  Sign up information is provided on this After Visit Summary.  MyChart is used to connect with patients for Virtual Visits (Telemedicine).  Patients are able to view lab/test results, encounter notes, upcoming appointments, etc.  Non-urgent messages can be sent to your provider as well.   To learn more about what you can do with MyChart, go to [email or web address].    Your next appointment:   To be arranged after procedure  The format for your next appointment:   In Person  Provider:   [name], MD  or Robbie Lis, PA-C, Melina Copa, PA-C, Cecilie Kicks, NP, Ermalinda Barrios, PA-C, or Richardson Dopp, [place]         Other Instructions   [facility] [place], [place] [address] Dept: [phone] Loc: [facility] [place]  02/25/2021  You are scheduled for a Cardiac Catheterization on Wednesday, December 7 with Dr. [name].  1. Please arrive at the [facility] (Main Entrance A) at [facility]: [address] [place], [place] [address] at 8:00 AM (This time is two hours before your procedure to ensure your preparation). Free valet parking service is available.   Special note: Every effort is made to have your procedure done on time. Please understand that emergencies sometimes delay scheduled procedures.  2. Diet: Do not eat solid foods after midnight.  The patient may have clear liquids until 5am upon the day of the procedure.  3. Labs: done in office today  4. Medication instructions in preparation for your procedure:  Contrast Allergy: No     On the morning of your procedure, take your Aspirin  81 mg and any morning medicines NOT listed above.  You may use sips of water.  5. Plan for one night stay--bring personal belongings. 6. Bring a current list of your medications and current insurance cards. 7. You MUST have a  responsible person to drive you home. 8. Someone MUST be with you the first 24 hours after you arrive home or your discharge will be delayed. 9. Please wear clothes that are easy to get on and off and wear slip-on shoes.  Thank you for allowing [place] to care for you!   -- [facility]

## 2021-02-26 ENCOUNTER — Encounter (HOSPITAL_COMMUNITY)
Admission: RE | Disposition: A | Discharge status: Home / Self Care | Payer: Self-pay | Source: Ambulatory Visit | Attending: Interventional Cardiology

## 2021-02-26 ENCOUNTER — Ambulatory Visit (HOSPITAL_COMMUNITY)
Admission: RE | Admit: 2021-02-26 | Discharge: 2021-02-26 | Disposition: A | Discharge status: Home / Self Care | Payer: Medicare Other | Source: Ambulatory Visit | Attending: Interventional Cardiology | Admitting: Interventional Cardiology

## 2021-02-26 ENCOUNTER — Other Ambulatory Visit (HOSPITAL_COMMUNITY): Payer: Self-pay

## 2021-02-26 DIAGNOSIS — I25119 Atherosclerotic heart disease of native coronary artery with unspecified angina pectoris: Secondary | ICD-10-CM | POA: Diagnosis not present

## 2021-02-26 DIAGNOSIS — Z955 Presence of coronary angioplasty implant and graft: Secondary | ICD-10-CM | POA: Insufficient documentation

## 2021-02-26 DIAGNOSIS — Z79899 Other long term (current) drug therapy: Secondary | ICD-10-CM | POA: Insufficient documentation

## 2021-02-26 DIAGNOSIS — E782 Mixed hyperlipidemia: Secondary | ICD-10-CM | POA: Diagnosis not present

## 2021-02-26 DIAGNOSIS — I739 Peripheral vascular disease, unspecified: Secondary | ICD-10-CM | POA: Diagnosis not present

## 2021-02-26 DIAGNOSIS — I209 Angina pectoris, unspecified: Secondary | ICD-10-CM

## 2021-02-26 HISTORY — PX: CORONARY STENT INTERVENTION: CATH118234

## 2021-02-26 HISTORY — PX: LEFT HEART CATH AND CORONARY ANGIOGRAPHY: CATH118249

## 2021-02-26 LAB — GLUCOSE, CAPILLARY: Glucose-Capillary: 116 mg/dL — ABNORMAL HIGH (ref 70–99)

## 2021-02-26 LAB — POCT ACTIVATED CLOTTING TIME: Activated Clotting Time: 329 seconds

## 2021-02-26 SURGERY — LEFT HEART CATH AND CORONARY ANGIOGRAPHY
Anesthesia: LOCAL

## 2021-02-26 MED ORDER — ASPIRIN 81 MG PO TBEC
81.0000 mg | DELAYED_RELEASE_TABLET | Freq: Every day | ORAL | 3 refills | Status: AC
  Filled 2021-02-26: qty 90, 90d supply, fill #0

## 2021-02-26 MED ORDER — PANTOPRAZOLE SODIUM 40 MG PO TBEC
40.0000 mg | DELAYED_RELEASE_TABLET | Freq: Every day | ORAL | 3 refills | Status: DC
  Filled 2021-02-26: qty 90, 90d supply, fill #0

## 2021-02-26 MED ORDER — HYDRALAZINE HCL 20 MG/ML IJ SOLN: 10.0000 mg | INTRAMUSCULAR | Status: DC | PRN

## 2021-02-26 MED ORDER — LABETALOL HCL 5 MG/ML IV SOLN: 10.0000 mg | INTRAVENOUS | Status: DC | PRN

## 2021-02-26 MED ORDER — CLOPIDOGREL BISULFATE 300 MG PO TABS
ORAL_TABLET | ORAL | Status: DC | PRN
  Administered 2021-02-26: 600 mg via ORAL

## 2021-02-26 MED ORDER — MIDAZOLAM HCL 2 MG/2ML IJ SOLN
INTRAMUSCULAR | Status: AC
  Filled 2021-02-26: qty 2

## 2021-02-26 MED ORDER — HEPARIN SODIUM (PORCINE) 1000 UNIT/ML IJ SOLN
INTRAMUSCULAR | Status: DC | PRN
  Administered 2021-02-26: 2000 [IU] via INTRAVENOUS
  Administered 2021-02-26: 5000 [IU] via INTRAVENOUS
  Administered 2021-02-26: 3000 [IU] via INTRAVENOUS

## 2021-02-26 MED ORDER — VERAPAMIL HCL 2.5 MG/ML IV SOLN
INTRAVENOUS | Status: AC
  Filled 2021-02-26: qty 2

## 2021-02-26 MED ORDER — FAMOTIDINE IN NACL 20-0.9 MG/50ML-% IV SOLN
INTRAVENOUS | Status: DC | PRN
  Administered 2021-02-26: 20 mg via INTRAVENOUS

## 2021-02-26 MED ORDER — HEPARIN SODIUM (PORCINE) 1000 UNIT/ML IJ SOLN
INTRAMUSCULAR | Status: AC
  Filled 2021-02-26: qty 10

## 2021-02-26 MED ORDER — ONDANSETRON HCL 4 MG/2ML IJ SOLN
4.0000 mg | Freq: Four times a day (QID) | INTRAMUSCULAR | Status: DC | PRN
  Administered 2021-02-26: 4 mg via INTRAVENOUS
  Filled 2021-02-26: qty 2

## 2021-02-26 MED ORDER — ASPIRIN 81 MG PO CHEW: 81.0000 mg | CHEWABLE_TABLET | ORAL | Status: DC

## 2021-02-26 MED ORDER — SODIUM CHLORIDE 0.9% FLUSH: 3.0000 mL | INTRAVENOUS | Status: DC | PRN

## 2021-02-26 MED ORDER — NITROGLYCERIN 1 MG/10 ML FOR IR/CATH LAB
INTRA_ARTERIAL | Status: AC
  Filled 2021-02-26: qty 10

## 2021-02-26 MED ORDER — LIDOCAINE HCL (PF) 1 % IJ SOLN
INTRAMUSCULAR | Status: AC
  Filled 2021-02-26: qty 30

## 2021-02-26 MED ORDER — FENTANYL CITRATE (PF) 100 MCG/2ML IJ SOLN
INTRAMUSCULAR | Status: DC | PRN
  Administered 2021-02-26 (×2): 25 ug via INTRAVENOUS

## 2021-02-26 MED ORDER — ASPIRIN 81 MG PO CHEW: 81.0000 mg | CHEWABLE_TABLET | Freq: Every day | ORAL | Status: DC

## 2021-02-26 MED ORDER — SODIUM CHLORIDE 0.9% FLUSH: 3.0000 mL | Freq: Two times a day (BID) | INTRAVENOUS | Status: DC

## 2021-02-26 MED ORDER — ACETAMINOPHEN 325 MG PO TABS
650.0000 mg | ORAL_TABLET | ORAL | Status: DC | PRN
  Administered 2021-02-26: 650 mg via ORAL
  Filled 2021-02-26: qty 2

## 2021-02-26 MED ORDER — SODIUM CHLORIDE 0.9 % IV SOLN: INTRAVENOUS | Status: AC

## 2021-02-26 MED ORDER — FENTANYL CITRATE (PF) 100 MCG/2ML IJ SOLN
INTRAMUSCULAR | Status: AC
  Filled 2021-02-26: qty 2

## 2021-02-26 MED ORDER — SODIUM CHLORIDE 0.9 % IV SOLN: 250.0000 mL | INTRAVENOUS | Status: DC | PRN

## 2021-02-26 MED ORDER — HEPARIN (PORCINE) IN NACL 1000-0.9 UT/500ML-% IV SOLN
INTRAVENOUS | Status: AC
  Filled 2021-02-26: qty 1000

## 2021-02-26 MED ORDER — CLOPIDOGREL BISULFATE 75 MG PO TABS
75.0000 mg | ORAL_TABLET | Freq: Every day | ORAL | 3 refills | Status: DC
  Filled 2021-02-26: qty 90, 90d supply, fill #0

## 2021-02-26 MED ORDER — SODIUM CHLORIDE 0.9 % WEIGHT BASED INFUSION: 1.0000 mL/kg/h | INTRAVENOUS | Status: DC

## 2021-02-26 MED ORDER — VERAPAMIL HCL 2.5 MG/ML IV SOLN
INTRAVENOUS | Status: DC | PRN
  Administered 2021-02-26: 10 mL via INTRA_ARTERIAL

## 2021-02-26 MED ORDER — MIDAZOLAM HCL 2 MG/2ML IJ SOLN
INTRAMUSCULAR | Status: DC | PRN
  Administered 2021-02-26: 1 mg via INTRAVENOUS
  Administered 2021-02-26: 2 mg via INTRAVENOUS

## 2021-02-26 MED ORDER — SODIUM CHLORIDE 0.9 % WEIGHT BASED INFUSION
3.0000 mL/kg/h | INTRAVENOUS | Status: AC
  Administered 2021-02-26: 3 mL/kg/h via INTRAVENOUS

## 2021-02-26 MED ORDER — ATORVASTATIN CALCIUM 80 MG PO TABS
80.0000 mg | ORAL_TABLET | Freq: Every day | ORAL | 3 refills | Status: DC
  Filled 2021-02-26: qty 90, 90d supply, fill #0

## 2021-02-26 MED ORDER — LIDOCAINE HCL (PF) 1 % IJ SOLN
INTRAMUSCULAR | Status: DC | PRN
  Administered 2021-02-26: 5 mL

## 2021-02-26 MED ORDER — NITROGLYCERIN 1 MG/10 ML FOR IR/CATH LAB
INTRA_ARTERIAL | Status: DC | PRN
  Administered 2021-02-26 (×2): 200 ug via INTRACORONARY
  Administered 2021-02-26: 300 ug via INTRACORONARY

## 2021-02-26 MED ORDER — IOHEXOL 350 MG/ML SOLN
INTRAVENOUS | Status: DC | PRN
  Administered 2021-02-26: 85 mL

## 2021-02-26 MED ORDER — CLOPIDOGREL BISULFATE 75 MG PO TABS: 75.0000 mg | ORAL_TABLET | Freq: Every day | ORAL | Status: DC

## 2021-02-26 MED ORDER — CLOPIDOGREL BISULFATE 300 MG PO TABS
ORAL_TABLET | ORAL | Status: AC
  Filled 2021-02-26: qty 2

## 2021-02-26 MED ORDER — FAMOTIDINE IN NACL 20-0.9 MG/50ML-% IV SOLN
INTRAVENOUS | Status: AC
  Filled 2021-02-26: qty 50

## 2021-02-26 SURGICAL SUPPLY — 18 items
BALLN SAPPHIRE 2.0X15 (BALLOONS) ×2
BALLN SAPPHIRE ~~LOC~~ 2.25X12 (BALLOONS) ×1 IMPLANT
BALLOON SAPPHIRE 2.0X15 (BALLOONS) IMPLANT
CATH 5FR JL3.5 JR4 ANG PIG MP (CATHETERS) ×1 IMPLANT
CATH LAUNCHER 6FR EBU3.5 (CATHETERS) ×1 IMPLANT
DEVICE RAD COMP TR BAND LRG (VASCULAR PRODUCTS) ×1 IMPLANT
GLIDESHEATH SLEND SS 6F .021 (SHEATH) ×1 IMPLANT
GUIDEWIRE INQWIRE 1.5J.035X260 (WIRE) IMPLANT
INQWIRE 1.5J .035X260CM (WIRE) ×2
KIT ENCORE 26 ADVANTAGE (KITS) ×1 IMPLANT
KIT HEART LEFT (KITS) ×2 IMPLANT
KIT HEMO VALVE WATCHDOG (MISCELLANEOUS) ×1 IMPLANT
PACK CARDIAC CATHETERIZATION (CUSTOM PROCEDURE TRAY) ×2 IMPLANT
SHEATH PROBE COVER 6X72 (BAG) ×1 IMPLANT
STENT ONYX FRONTIER 2.0X22 (Permanent Stent) ×1 IMPLANT
TRANSDUCER W/STOPCOCK (MISCELLANEOUS) ×2 IMPLANT
TUBING CIL FLEX 10 FLL-RA (TUBING) ×2 IMPLANT
WIRE ASAHI PROWATER 180CM (WIRE) ×1 IMPLANT

## 2021-02-26 NOTE — Interval H&P Note (Signed)
Cath Lab Visit (complete for each Cath Lab visit)  Clinical Evaluation Leading to the Procedure:   ACS: No.  Non-ACS:    Anginal Classification: CCS III  Anti-ischemic medical therapy: Minimal Therapy (1 class of medications)  Non-Invasive Test Results: No non-invasive testing performed  Prior CABG: No previous CABG      History and Physical Interval Note:  02/26/2021 10:37 AM  Alex [name]  has presented today for surgery, with the diagnosis of chest pain.  The various methods of treatment have been discussed with the patient and family. After consideration of risks, benefits and other options for treatment, the patient has consented to  Procedure(s): LEFT HEART CATH AND CORONARY ANGIOGRAPHY (N/A) as a surgical intervention.  The patient's history has been reviewed, patient examined, no change in status, stable for surgery.  I have reviewed the patient's chart and labs.  Questions were answered to the patient's satisfaction.     Alex [name]

## 2021-02-26 NOTE — Progress Notes (Signed)
CARDIAC REHAB PHASE I   Stent education completed with pt. Pt educated on importance of ASA, Plavix, statin, and NTG. Pt given stent card and heart healthy diet. Reviewed site care, restrictions, and exercise guidelines. Will refer to CRP II [place] Rufina Falco, RN BSN 02/26/2021 2:19 PM

## 2021-02-26 NOTE — Progress Notes (Signed)
Pt seen by pharmacy, rehab, [place] PA, and Dr. Irish Lack prior to DC. AVS reviewed with pt and wife by Cardell Peach, RN.  Pt ambulated without difficulty or bleeding.   Discharged home with his wife who will drive and stay with pt x 24 hrs.

## 2021-02-26 NOTE — Discharge Summary (Addendum)
Discharge Summary for Same Day PCI   Patient ID: Alex [name] MRN: [id number]; DOB: 1950/12/10  Admit date: 02/26/2021 Discharge date: 02/26/2021  Primary Care Provider: Alroy Dust, L.Marlou Sa, MD  Primary Cardiologist: [name], MD  Primary Electrophysiologist:  None   Discharge Diagnoses    Principal Problem:   Angina pectoris [facility]) Active Problems:   Mixed hyperlipidemia   Diagnostic Studies/Procedures    Cardiac Catheterization 02/26/2021:    1st Diag lesion is 90% stenosed.   A drug-eluting stent was successfully placed using a STENT ONYX FRONTIER 2.0X22.   Post intervention, there is a 0% residual stenosis.   The left ventricular systolic function is normal.   LV end diastolic pressure is normal.   The left ventricular ejection fraction is 55-65% by visual estimate.   There is no aortic valve stenosis.   Mid LAD bridging noted.   Continue aggressive secondary prevention.  DAPT for 6 months with Plavix.  Plan for same day discharge.   Diagnostic Dominance: Right Intervention     _____________   History of Present Illness     Alex [name] is a 70 y.o. male with PMH of right CEA '11, GERD, HLD. Was recently seen in the office for exertional chest discomfort with walking up stairs and hills. Given this, he was set up for outpatient cardiac cath.   Hospital Course     The patient underwent cardiac cath as noted above with 90% 1st diag lesion treated with PCI/DES x1. Plan for DAPT with ASA/plavix for at least 6 months. The patient was seen by cardiac rehab while in short stay. There were no observed complications post cath. Radial cath site was re-evaluated prior to discharge and found to be stable without any complications. Instructions/precautions regarding cath site care were given prior to discharge. Medications adjusted to include atorvastatin 80mg  daily, as well as switching to protonix with the need for plavix.    Alex [name] was seen by Dr.  Christ Kick and determined stable for discharge home. Follow up with our office has been arranged. Medications are listed below. Pertinent changes include plavix.  _____________  Cath/PCI Registry Performance & Quality Measures: Aspirin prescribed? - Yes ADP Receptor Inhibitor (Plavix/Clopidogrel, Brilinta/Ticagrelor or Effient/Prasugrel) prescribed (includes medically managed patients)? - Yes High Intensity Statin (Lipitor 40-80mg  or Crestor 20-40mg ) prescribed? - Yes For EF <40%, was ACEI/ARB prescribed? - Not Applicable (EF >/= 01%) For EF <40%, Aldosterone Antagonist (Spironolactone or Eplerenone) prescribed? - Not Applicable (EF >/= 09%) Cardiac Rehab Phase II ordered (Included Medically managed Patients)? - Yes  _____________   Discharge Vitals Blood pressure (!) 101/57, pulse 80, temperature 98.2 F (36.8 C), temperature source Oral, resp. rate 12, height 5\' 7"  (1.702 m), weight 59.4 kg, SpO2 98 %.  Filed Weights   02/26/21 0802  Weight: 59.4 kg    Last Labs & Radiologic Studies    CBC Recent Labs    02/25/21 1051  WBC 6.4  HGB 14.8  HCT 43.3  MCV 92  PLT 323   Basic Metabolic Panel Recent Labs    02/25/21 1051  NA 138  K 4.9  CL 103  CO2 26  GLUCOSE 95  BUN 18  CREATININE 1.01  CALCIUM 9.6   Liver Function Tests No results for input(s): AST, ALT, ALKPHOS, BILITOT, PROT, ALBUMIN in the last 72 hours. No results for input(s): LIPASE, AMYLASE in the last 72 hours. High Sensitivity Troponin:   No results for input(s): TROPONINIHS in the last 720 hours.  BNP  Invalid input(s): POCBNP D-Dimer No results for input(s): DDIMER in the last 72 hours. Hemoglobin A1C No results for input(s): HGBA1C in the last 72 hours. Fasting Lipid Panel No results for input(s): CHOL, HDL, LDLCALC, TRIG, CHOLHDL, LDLDIRECT in the last 72 hours. Thyroid Function Tests No results for input(s): TSH, T4TOTAL, T3FREE, THYROIDAB in the last 72 hours.  Invalid input(s):  FREET3 _____________  CARDIAC CATHETERIZATION  Addendum Date: 02/26/2021     1st Diag lesion is 90% stenosed.   A drug-eluting stent was successfully placed using a STENT ONYX FRONTIER 2.0X22.   Post intervention, there is a 0% residual stenosis.   The left ventricular systolic function is normal.   LV end diastolic pressure is normal.   The left ventricular ejection fraction is 55-65% by visual estimate.   There is no aortic valve stenosis.   Mid LAD bridging noted. Continue aggressive secondary prevention.  DAPT for 6 months with Plavix.  Plan for same day discharge.   Result Date: 02/26/2021   1st Diag lesion is 90% stenosed.   A drug-eluting stent was successfully placed using a STENT ONYX FRONTIER 2.0X22.   Post intervention, there is a 0% residual stenosis.   The left ventricular systolic function is normal.   LV end diastolic pressure is normal.   The left ventricular ejection fraction is 55-65% by visual estimate.   There is no aortic valve stenosis. Continue aggressive secondary prevention.  DAPT for 6 months with Plavix.  Plan for same day discharge.    Disposition   Pt is being discharged home today in good condition.  Follow-up Plans & Appointments     Follow-up Information     Jettie Booze, MD Follow up on 03/03/2021.   Specialties: Cardiology, Radiology, Interventional Cardiology Why: at 4pm for your follow up appt. Contact information: [address] [place] [address] [phone]                Discharge Instructions     Amb Referral to Cardiac Rehabilitation   Complete by: As directed    Diagnosis: Coronary Stents   After initial evaluation and assessments completed: Virtual Based Care may be provided alone or in conjunction with Phase 2 Cardiac Rehab based on patient barriers.: Yes        Discharge Medications   Allergies as of 02/26/2021   No Known Allergies      Medication List     STOP taking these medications     omeprazole 20 MG capsule Commonly known as: PRILOSEC   simvastatin 40 MG tablet Commonly known as: ZOCOR       TAKE these medications    acetaminophen 500 MG tablet Commonly known as: TYLENOL Take 500 mg by mouth every 6 (six) hours as needed.   albuterol 108 (90 Base) MCG/ACT inhaler Commonly known as: VENTOLIN HFA Inhale into the lungs every 6 (six) hours as needed for wheezing or shortness of breath.   Aspirin Low Dose 81 MG EC tablet Generic drug: aspirin Take 1 tablet (81 mg total) by mouth daily.   atorvastatin 80 MG tablet Commonly known as: Lipitor Take 1 tablet (80 mg total) by mouth daily.   clonazePAM 0.5 MG tablet Commonly known as: KLONOPIN Take 0.5 mg by mouth 2 (two) times daily as needed for anxiety.   clopidogrel 75 MG tablet Commonly known as: PLAVIX Take 1 tablet (75 mg total) by mouth daily with breakfast. Start taking on: February 27, 2021   guaiFENesin-codeine 100-10  MG/5ML syrup Commonly known as: ROBITUSSIN AC Take 5 mLs by mouth 3 (three) times daily as needed for cough.   ibuprofen 200 MG tablet Commonly known as: ADVIL Take 200 mg by mouth every 6 (six) hours as needed.   multivitamin tablet Take 1 tablet by mouth daily.   nitroGLYCERIN 0.4 MG SL tablet Commonly known as: NITROSTAT Place 1 tablet (0.4 mg total) under the tongue every 5 (five) minutes as needed for chest pain.   pantoprazole 40 MG tablet Commonly known as: Protonix Take 1 tablet (40 mg total) by mouth daily.        Allergies No Known Allergies  Outstanding Labs/Studies   N/a   Duration of Discharge Encounter   Greater than 30 minutes including physician time.  Signed, Reino Bellis, NP 02/26/2021, 3:34 PM  I have examined the patient and reviewed assessment and plan and discussed with patient.  Agree with above as stated.    Right wrist stable.  Plan on discharge later today.  I stressed the importance of DAPT.    [name]

## 2021-02-27 ENCOUNTER — Encounter (HOSPITAL_COMMUNITY): Payer: Self-pay | Admitting: Interventional Cardiology

## 2021-02-28 ENCOUNTER — Other Ambulatory Visit: Payer: Self-pay

## 2021-02-28 MED ORDER — PANTOPRAZOLE SODIUM 40 MG PO TBEC
40.0000 mg | DELAYED_RELEASE_TABLET | Freq: Every day | ORAL | 3 refills | Status: AC
Start: 2021-02-28 — End: 2022-02-28

## 2021-02-28 MED ORDER — ATORVASTATIN CALCIUM 80 MG PO TABS: 80.0000 mg | ORAL_TABLET | Freq: Every day | ORAL | 3 refills | Status: AC

## 2021-02-28 MED ORDER — CLOPIDOGREL BISULFATE 75 MG PO TABS: 75.0000 mg | ORAL_TABLET | Freq: Every day | ORAL | 3 refills | Status: DC

## 2021-03-02 NOTE — Progress Notes (Signed)
Cardiology Office Note   Date:  03/03/2021   ID:  Alex [name], DOB [birth year], MRN [id number]  PCP:  Alroy Dust, L.Marlou Sa, MD    No chief complaint on file.  CAD  Wt Readings from Last 3 Encounters:  03/03/21 131 lb 12.8 oz (59.8 kg)  02/26/21 131 lb (59.4 kg)  02/25/21 131 lb 9.6 oz (59.7 kg)       History of Present Illness: Alex [name] is a 70 y.o. male  who has had a Right CEA in 2011.   Had exertional chest discomfort in 2022 which led to cath: " 1st Diag lesion is 90% stenosed.   A drug-eluting stent was successfully placed using a STENT ONYX FRONTIER 2.0X22.   Post intervention, there is a 0% residual stenosis.   The left ventricular systolic function is normal.   LV end diastolic pressure is normal.   The left ventricular ejection fraction is 55-65% by visual estimate.   There is no aortic valve stenosis.   Mid LAD bridging noted.   Continue aggressive secondary prevention.  DAPT for 6 months with Plavix.  Plan for same day discharge."  He has had some bruising post procedure.  He has tried to walk and still has some DOE.  He is up to 15 minutes at a time.  He was hoping that he would be back to full speed after PCI, but thinking back, he thinks he has been limiting his exertion for quite some time.  Denies : Dizziness. Leg edema. Nitroglycerin use. Orthopnea. Palpitations. Paroxysmal nocturnal dyspnea.  Syncope.      Past Medical History:  Diagnosis Date   Allergic rhinitis    Anxiety    Aortic atherosclerosis (HCC)    Arthritis    Carotid artery occlusion    Cough    GERD (gastroesophageal reflux disease)    Hip pain    Hyperlipidemia     Past Surgical History:  Procedure Laterality Date   CAROTID ENDARTERECTOMY  July 02, 2009   Right cea   CORONARY STENT INTERVENTION N/A 02/26/2021   Procedure: CORONARY STENT INTERVENTION;  Surgeon: Jettie Booze, MD;  Location: [facility];  Service: Cardiovascular;  Laterality: N/A;   DIAG   EYE SURGERY  May  2011   Bilateral cataract   HERNIA REPAIR     LEFT HEART CATH AND CORONARY ANGIOGRAPHY N/A 02/26/2021   Procedure: LEFT HEART CATH AND CORONARY ANGIOGRAPHY;  Surgeon: Jettie Booze, MD;  Location: [facility];  Service: Cardiovascular;  Laterality: N/A;     Current Outpatient Medications  Medication Sig Dispense Refill   acetaminophen (TYLENOL) 500 MG tablet Take 500 mg by mouth every 6 (six) hours as needed.     albuterol (VENTOLIN HFA) 108 (90 Base) MCG/ACT inhaler Inhale into the lungs every 6 (six) hours as needed for wheezing or shortness of breath.     aspirin 81 MG EC tablet Take 1 tablet (81 mg total) by mouth daily. 90 tablet 3   atorvastatin (LIPITOR) 80 MG tablet Take 1 tablet (80 mg total) by mouth daily. 90 tablet 3   clonazePAM (KLONOPIN) 0.5 MG tablet Take 0.5 mg by mouth 2 (two) times daily as needed for anxiety.     clopidogrel (PLAVIX) 75 MG tablet Take 1 tablet (75 mg total) by mouth daily with breakfast. 90 tablet 3   guaiFENesin-codeine (ROBITUSSIN AC) 100-10 MG/5ML syrup Take 5 mLs by mouth 3 (three) times daily as needed for cough.  ibuprofen (ADVIL) 200 MG tablet Take 200 mg by mouth every 6 (six) hours as needed.     nitroGLYCERIN (NITROSTAT) 0.4 MG SL tablet Place 1 tablet (0.4 mg total) under the tongue every 5 (five) minutes as needed for chest pain. 25 tablet 6   pantoprazole (PROTONIX) 40 MG tablet Take 1 tablet (40 mg total) by mouth daily. 90 tablet 3   Multiple Vitamin (MULTIVITAMIN) tablet Take 1 tablet by mouth daily. (Patient not taking: Reported on 03/03/2021)     No current facility-administered medications for this visit.    Allergies:   Patient has no known allergies.    Social History:  The patient  reports that he quit smoking about 15 years ago. His smoking use included cigarettes. He has never used smokeless tobacco. He reports current alcohol use. He reports current drug use.   Family History:  The  patient's family history includes Diabetes in his brother.    ROS:  Please see the history of present illness.   Otherwise, review of systems are positive for bruising at the right wrist.   All other systems are reviewed and negative.    PHYSICAL EXAM: VS:  BP 112/60   Pulse 62   Ht 5\' 7"  (1.702 m)   Wt 131 lb 12.8 oz (59.8 kg)   SpO2 95%   BMI 20.64 kg/m  , BMI Body mass index is 20.64 kg/m. GEN: Well nourished, well developed, in no acute distress HEENT: normal Neck: no JVD, carotid bruits, or masses Cardiac: RRR; no murmurs, rubs, or gallops,no edema  Respiratory:  clear to auscultation bilaterally, normal work of breathing GI: soft, nontender, nondistended, + BS MS: no deformity or atrophy Skin: warm and dry, no rash Neuro:  Strength and sensation are intact Psych: euthymic mood, full affect    Recent Labs: 02/25/2021: BUN 18; Creatinine, Ser 1.01; Hemoglobin 14.8; Platelets 167; Potassium 4.9; Sodium 138   Lipid Panel No results found for: CHOL, TRIG, HDL, CHOLHDL, VLDL, LDLCALC, LDLDIRECT   Other studies Reviewed: Additional studies/ records that were reviewed today with results demonstrating: Cath results reviewed with the patient.  LDL 70 in October 2022.   ASSESSMENT AND PLAN:  CAD: s/p diagonal stent.  Needs DAPT.  Watch for any signs of bleeding.  He has easy bruising at this point.  Dyspnea on exertion persists.  I encouraged him to gradually increase his activity.  I think part of it is deconditioning.  Part of it also may be cold air as he feels it difficult to get a deep breath. Hyperlipidemia: The current medical regimen is effective;  continue present plan and medications.  Continue atorvastatin 80 mg daily. Whole food, plant based diet.  Apparently, Doritos or his favorite food per his wife. PAD: s/p CEA. Continue aggressive secondary prevention and routine f/u.    Current medicines are reviewed at length with the patient today.  The patient concerns  regarding his medicines were addressed.  The following changes have been made:  No change  Labs/ tests ordered today include:  No orders of the defined types were placed in this encounter.   Recommend 150 minutes/week of aerobic exercise Low fat, low carb, high fiber diet recommended  Disposition:   FU in 6 months   Signed, [name], MD  03/03/2021 [facility] [address], [place], [place]  [address] Phone: [phone]; Fax: [phone]

## 2021-03-03 ENCOUNTER — Encounter: Payer: Self-pay | Admitting: Interventional Cardiology

## 2021-03-03 ENCOUNTER — Other Ambulatory Visit: Payer: Self-pay

## 2021-03-03 ENCOUNTER — Ambulatory Visit: Payer: Medicare Other | Admitting: Interventional Cardiology

## 2021-03-03 VITALS — BP 112/60 | HR 62 | Ht 67.0 in | Wt 131.8 lb

## 2021-03-03 DIAGNOSIS — E782 Mixed hyperlipidemia: Secondary | ICD-10-CM | POA: Diagnosis not present

## 2021-03-03 DIAGNOSIS — I739 Peripheral vascular disease, unspecified: Secondary | ICD-10-CM | POA: Diagnosis not present

## 2021-03-03 DIAGNOSIS — I6529 Occlusion and stenosis of unspecified carotid artery: Secondary | ICD-10-CM | POA: Diagnosis not present

## 2021-03-03 DIAGNOSIS — Z955 Presence of coronary angioplasty implant and graft: Secondary | ICD-10-CM

## 2021-03-03 NOTE — Patient Instructions (Addendum)
Medication Instructions:  Your physician recommends that you continue on your current medications as directed. Please refer to the Current Medication list given to you today.  *If you need a refill on your cardiac medications before your next appointment, please call your pharmacy*   Lab Work: none If you have labs (blood work) drawn today and your tests are completely normal, you will receive your results only by: [facility] (if you have MyChart) OR A paper copy in the mail If you have any lab test that is abnormal or we need to change your treatment, we will call you to review the results.   Testing/Procedures: none   Follow-Up: At [facility], you and your health needs are our priority.  As part of our continuing mission to provide you with exceptional heart care, we have created designated Provider Care Teams.  These Care Teams include your primary Cardiologist (physician) and Advanced Practice Providers (APPs -  Physician Assistants and Nurse Practitioners) who all work together to provide you with the care you need, when you need it.  We recommend signing up for the patient portal called "MyChart".  Sign up information is provided on this After Visit Summary.  MyChart is used to connect with patients for Virtual Visits (Telemedicine).  Patients are able to view lab/test results, encounter notes, upcoming appointments, etc.  Non-urgent messages can be sent to your provider as well.   To learn more about what you can do with MyChart, go to [email or web address].    Your next appointment:   August 22, 2021 at 9:20  The format for your next appointment:   In Person  Provider:   [name], MD     Other Instructions  High-Fiber Eating Plan Fiber, also called dietary fiber, is a type of carbohydrate. It is found foods such as fruits, vegetables, whole grains, and beans. A high-fiber diet can have many health benefits. Your health care provider may recommend a  high-fiber diet to help: Prevent constipation. Fiber can make your bowel movements more regular. Lower your cholesterol. Relieve the following conditions: Inflammation of veins in the anus (hemorrhoids). Inflammation of specific areas of the digestive tract (uncomplicated diverticulosis). A problem of the large intestine, also called the colon, that sometimes causes pain and diarrhea (irritable bowel syndrome, or IBS). Prevent overeating as part of a weight-loss plan. Prevent heart disease, type 2 diabetes, and certain cancers. What are tips for following this plan? Reading food labels  Check the nutrition facts label on food products for the amount of dietary fiber. Choose foods that have 5 grams of fiber or more per serving. The goals for recommended daily fiber intake include: Men (age 14 or younger): 34-38 g. Men (over age 20): 28-34 g. Women (age 63 or younger): 25-28 g. Women (over age 56): 22-25 g. Your daily fiber goal is _____________ g. Shopping Choose whole fruits and vegetables instead of processed forms, such as apple juice or applesauce. Choose a wide variety of high-fiber foods such as avocados, lentils, oats, and kidney beans. Read the nutrition facts label of the foods you choose. Be aware of foods with added fiber. These foods often have high sugar and sodium amounts per serving. Cooking Use whole-grain flour for baking and cooking. Cook with brown rice instead of white rice. Meal planning Start the day with a breakfast that is high in fiber, such as a cereal that contains 5 g of fiber or more per serving. Eat breads and cereals that are made with whole-grain  flour instead of refined flour or white flour. Eat brown rice, bulgur wheat, or millet instead of white rice. Use beans in place of meat in soups, salads, and pasta dishes. Be sure that half of the grains you eat each day are whole grains. General information You can get the recommended daily intake of dietary  fiber by: Eating a variety of fruits, vegetables, grains, nuts, and beans. Taking a fiber supplement if you are not able to take in enough fiber in your diet. It is better to get fiber through food than from a supplement. Gradually increase how much fiber you consume. If you increase your intake of dietary fiber too quickly, you may have bloating, cramping, or gas. Drink plenty of water to help you digest fiber. Choose high-fiber snacks, such as berries, raw vegetables, nuts, and popcorn. What foods should I eat? Fruits Berries. Pears. Apples. Oranges. Avocado. Prunes and raisins. Dried figs. Vegetables Sweet potatoes. Spinach. Kale. Artichokes. Cabbage. Broccoli. Cauliflower. Green peas. Carrots. Squash. Grains Whole-grain breads. Multigrain cereal. Oats and oatmeal. Brown rice. Barley. Bulgur wheat. [place]kin. Quinoa. Bran muffins. Popcorn. Rye wafer crackers. Meats and other proteins Navy beans, kidney beans, and pinto beans. Soybeans. Split peas. Lentils. Nuts and seeds. Dairy Fiber-fortified yogurt. Beverages Fiber-fortified soy milk. Fiber-fortified orange juice. Other foods Fiber bars. The items listed above may not be a complete list of recommended foods and beverages. Contact a dietitian for more information. What foods should I avoid? Fruits Fruit juice. Cooked, strained fruit. Vegetables Fried potatoes. Canned vegetables. Well-cooked vegetables. Grains White bread. Pasta made with refined flour. White rice. Meats and other proteins Fatty cuts of meat. Fried chicken or fried fish. Dairy Milk. Yogurt. Cream cheese. Sour cream. Fats and oils Butters. Beverages Soft drinks. Other foods Cakes and pastries. The items listed above may not be a complete list of foods and beverages to avoid. Talk with your dietitian about what choices are best for you. Summary Fiber is a type of carbohydrate. It is found in foods such as fruits, vegetables, whole grains, and beans. A  high-fiber diet has many benefits. It can help to prevent constipation, lower blood cholesterol, aid weight loss, and reduce your risk of heart disease, diabetes, and certain cancers. Increase your intake of fiber gradually. Increasing fiber too quickly may cause cramping, bloating, and gas. Drink plenty of water while you increase the amount of fiber you consume. The best sources of fiber include whole fruits and vegetables, whole grains, nuts, seeds, and beans. This information is not intended to replace advice given to you by your health care provider. Make sure you discuss any questions you have with your health care provider. Document Revised: 07/13/2019 Document Reviewed: 07/13/2019 Elsevier Patient Education  2022 [employer].

## 2021-03-04 ENCOUNTER — Telehealth (HOSPITAL_COMMUNITY): Payer: Self-pay

## 2021-03-04 NOTE — Telephone Encounter (Signed)
Called pt to see if he is interested in the cardiac rehab, pt stated that he is not interested at this time. Closed referral.

## 2021-03-14 ENCOUNTER — Other Ambulatory Visit (HOSPITAL_COMMUNITY): Payer: Self-pay

## 2021-03-14 ENCOUNTER — Telehealth (HOSPITAL_COMMUNITY): Payer: Self-pay

## 2021-03-14 NOTE — Telephone Encounter (Signed)
Pharmacy Transitions of Care Follow-up Telephone Call  Date of discharge: 02/26/21  Discharge Diagnosis: stent placement  How have you been since you were released from the hospital?  Patient doing well since discharge, no questions about meds at this time. Patient usually uses ibuprofen for pain but stated he would switch to tylenol while on clopidogrel.  Medication changes made at discharge:     START taking: Aspirin Low Dose (aspirin)  Clopidogrel STOP taking: omeprazole 20 MG capsule (PRILOSEC)  simvastatin 40 MG tablet (ZOCOR)   Medication changes verified by the patient? Yes    Medication Accessibility:  Home Pharmacy: [facility]  Was the patient provided with refills on discharged medications? Yes, but discontinued due to reorder   Have all prescriptions been transferred from [facility] to home pharmacy? N/A   Is the patient able to afford medications? Has insurance    Medication Review:  CLOPIDOGREL (PLAVIX) Clopidogrel 75 mg once daily.  - Educated patient on expected duration of therapy of ASA with clopidogrel.  - Advised patient of medications to avoid (NSAIDs, ASA)  - Educated that Tylenol (acetaminophen) will be the preferred analgesic to prevent risk of bleeding  - Emphasized importance of monitoring for signs and symptoms of bleeding (abnormal bruising, prolonged bleeding, nose bleeds, bleeding from gums, discolored urine, black tarry stools)  - Advised patient to alert all providers of anticoagulation therapy prior to starting a new medication or having a procedure    Follow-up Appointments:  [facility] f/u appt confirmed?  Seen by Dr. Irish Lack on 03/03/21. Scheduled to see Dr. Irish Lack on 08/22/21 @ 9:20am.   If their condition worsens, is the pt aware to call PCP or go to the Emergency Dept.? yes  Final Patient Assessment: Patient has f/u scheduled and refills at home pharmacy.

## 2021-07-07 ENCOUNTER — Telehealth: Payer: Self-pay | Admitting: Interventional Cardiology

## 2021-07-07 DIAGNOSIS — R059 Cough, unspecified: Secondary | ICD-10-CM | POA: Diagnosis not present

## 2021-07-07 DIAGNOSIS — J309 Allergic rhinitis, unspecified: Secondary | ICD-10-CM | POA: Diagnosis not present

## 2021-07-07 DIAGNOSIS — I2510 Atherosclerotic heart disease of native coronary artery without angina pectoris: Secondary | ICD-10-CM | POA: Diagnosis not present

## 2021-07-07 DIAGNOSIS — I739 Peripheral vascular disease, unspecified: Secondary | ICD-10-CM | POA: Diagnosis not present

## 2021-07-07 DIAGNOSIS — R0602 Shortness of breath: Secondary | ICD-10-CM | POA: Diagnosis not present

## 2021-07-07 DIAGNOSIS — R079 Chest pain, unspecified: Secondary | ICD-10-CM | POA: Diagnosis not present

## 2021-07-07 NOTE — Telephone Encounter (Signed)
Spoke with Beverlee Nims at [facility]. The patient is in the office and complains of intermittent chest pain for the past several weeks. He describes it as a tightness. He does have some shortness of breath with exertion as well. Denies any associated N/V, diaphoresis or dizziness. He reports radiation of pain around left side as well has pain in his shoulder. He did take a nitroglycerin several days ago which he reports helped. He states that tightness does worsen when he walks. He reports pain is similar to what he experienced last year prior to heart cath. They did an EKG at [facility] and it was normal. I have advised that they send the patient to the ER. She states that Dr. Alroy Dust is wanting to get him in to see [place] this week. I have scheduled the patient to see DOD tomorrow afternoon but once again reiterated that the patient should be evaluated in the ER. She verbalized understanding and will fax over EKG from today. ?

## 2021-07-07 NOTE — Telephone Encounter (Signed)
Pt c/o of Chest Pain: STAT if CP now or developed within 24 hours ? ?1. Are you having CP right now? yes ? ?2. Are you experiencing any other symptoms (ex. SOB, nausea, vomiting, sweating)? no ? ?3. How long have you been experiencing CP? 2 weeks ? ?4. Is your CP continuous or coming and going? Continuous but gets worse with exertion ? ?5. Have you taken Nitroglycerin? Not sure ??  ? ?Diane from [facility] at [facility] states the patient is currently in their office with chest pain. She says the patient thought it was a pulled muscle, but it has not gone away. She states his EKG was normal and they are sending him for an x-ray tomorrow.  ? ?

## 2021-07-08 ENCOUNTER — Other Ambulatory Visit: Payer: Self-pay | Admitting: Physician Assistant

## 2021-07-08 ENCOUNTER — Encounter: Payer: Self-pay | Admitting: Internal Medicine

## 2021-07-08 ENCOUNTER — Ambulatory Visit
Admission: RE | Admit: 2021-07-08 | Discharge: 2021-07-08 | Disposition: A | Discharge status: Home / Self Care | Payer: Medicare Other | Source: Ambulatory Visit | Attending: Physician Assistant | Admitting: Physician Assistant

## 2021-07-08 ENCOUNTER — Ambulatory Visit: Payer: Medicare Other | Admitting: Internal Medicine

## 2021-07-08 VITALS — BP 120/72 | HR 75 | Ht 67.0 in | Wt 123.0 lb

## 2021-07-08 DIAGNOSIS — J90 Pleural effusion, not elsewhere classified: Secondary | ICD-10-CM | POA: Diagnosis not present

## 2021-07-08 DIAGNOSIS — I7300 Raynaud's syndrome without gangrene: Secondary | ICD-10-CM

## 2021-07-08 DIAGNOSIS — I25118 Atherosclerotic heart disease of native coronary artery with other forms of angina pectoris: Secondary | ICD-10-CM | POA: Diagnosis not present

## 2021-07-08 DIAGNOSIS — R072 Precordial pain: Secondary | ICD-10-CM | POA: Insufficient documentation

## 2021-07-08 DIAGNOSIS — R0602 Shortness of breath: Secondary | ICD-10-CM

## 2021-07-08 MED ORDER — DILTIAZEM HCL ER COATED BEADS 120 MG PO CP24: 120.0000 mg | ORAL_CAPSULE | Freq: Every day | ORAL | 3 refills | Status: DC

## 2021-07-08 NOTE — Progress Notes (Signed)
?Cardiology Office Note:   ? ?Date:  07/08/2021  ? ?ID:  Alex [name], DOB [birth year], MRN [id number] ? ?PCP:  Alroy Dust, L.Marlou Sa, MD ?  ?[facility] Providers ?Cardiologist:  [name], MD    ? ?Referring MD: Alroy Dust, L.Marlou Sa, MD  ? ?DOD:  CP with recent cath ? ?History of Present Illness:   ? ?Alex [name] is a 71 y.o. male with a CAD (recent D1 PCI) who is presenting as a DOD visit with chest pain.  He has had relatively isolated CAD. ? ?Patient notes that he is doing still having chest pain.   ?Since his PCI he has noted to improvement in chest pain: he can hike two or three miles but is still having symptoms.   ?Chest pain radiates to side and feels tight; there is no change with exertion. ?He has taken his PRN nitro and is unclear if he feels there is a benefit. ?He notes that on Plavix his sense of taste is blunted. ?He notes a history of Raynauds, but notes he is doing well from a radial perspective (had bruising prior to [place]. ? ? ?Past Medical History:  ?Diagnosis Date  ? Allergic rhinitis   ? Anxiety   ? Aortic atherosclerosis ([place])   ? Arthritis   ? Carotid artery occlusion   ? Cough   ? GERD (gastroesophageal reflux disease)   ? Hip pain   ? Hyperlipidemia   ? ? ?Past Surgical History:  ?Procedure Laterality Date  ? CAROTID ENDARTERECTOMY  July 02, 2009  ? Right cea  ? CORONARY STENT INTERVENTION N/A 02/26/2021  ? Procedure: CORONARY STENT INTERVENTION;  Surgeon: Jettie Booze, MD;  Location: [facility];  Service: Cardiovascular;  Laterality: N/A;  DIAG  ? EYE SURGERY  May  2011  ? Bilateral cataract  ? HERNIA REPAIR    ? LEFT HEART CATH AND CORONARY ANGIOGRAPHY N/A 02/26/2021  ? Procedure: LEFT HEART CATH AND CORONARY ANGIOGRAPHY;  Surgeon: Jettie Booze, MD;  Location: [facility];  Service: Cardiovascular;  Laterality: N/A;  ? ? ?Current Medications: ?Current Meds  ?Medication Sig  ? acetaminophen (TYLENOL) 500 MG tablet Take 500 mg by mouth every 6 (six)  hours as needed.  ? albuterol (VENTOLIN HFA) 108 (90 Base) MCG/ACT inhaler Inhale into the lungs every 6 (six) hours as needed for wheezing or shortness of breath.  ? aspirin 81 MG EC tablet Take 1 tablet (81 mg total) by mouth daily.  ? atorvastatin (LIPITOR) 80 MG tablet Take 1 tablet (80 mg total) by mouth daily.  ? clonazePAM (KLONOPIN) 0.5 MG tablet Take 0.5 mg by mouth 2 (two) times daily as needed for anxiety.  ? clopidogrel (PLAVIX) 75 MG tablet Take 1 tablet (75 mg total) by mouth daily with breakfast.  ? diltiazem (CARDIZEM CD) 120 MG 24 hr capsule Take 1 capsule (120 mg total) by mouth daily.  ? guaiFENesin-codeine (ROBITUSSIN AC) 100-10 MG/5ML syrup Take 5 mLs by mouth 3 (three) times daily as needed for cough.  ? ibuprofen (ADVIL) 200 MG tablet Take 200 mg by mouth every 6 (six) hours as needed.  ? nitroGLYCERIN (NITROSTAT) 0.4 MG SL tablet Place 1 tablet (0.4 mg total) under the tongue every 5 (five) minutes as needed for chest pain.  ? pantoprazole (PROTONIX) 40 MG tablet Take 1 tablet (40 mg total) by mouth daily.  ?  ? ?Allergies:   Patient has no known allergies.  ? ?Social History  ? ?Socioeconomic History  ?  Marital status: Married  ?  Spouse name: Not on file  ? Number of children: Not on file  ? Years of education: Not on file  ? Highest education level: Not on file  ?Occupational History  ? Not on file  ?Tobacco Use  ? Smoking status: Former  ?  Types: Cigarettes  ?  Quit date: 06/21/2005  ?  Years since quitting: 16.0  ? Smokeless tobacco: Never  ?Substance and Sexual Activity  ? Alcohol use: Yes  ? Drug use: Yes  ? Sexual activity: Not on file  ?Other Topics Concern  ? Not on file  ?Social History Narrative  ? Not on file  ? ?Social Determinants of Health  ? ?Financial Resource Strain: Not on file  ?Food Insecurity: Not on file  ?Transportation Needs: Not on file  ?Physical Activity: Not on file  ?Stress: Not on file  ?Social Connections: Not on file  ?  ? ?Family History: ?The patient's  family history includes Diabetes in his brother. ? ?ROS:   ?Please see the history of present illness.    ? All other systems reviewed and are negative. ? ?EKGs/Labs/Other Studies Reviewed:   ? ?The following studies were reviewed today: ? ?EKG:  EKG is  ordered today.  The ekg ordered today demonstrates  ?07/08/21. SR iRBBB ? ?Recent Labs: ?02/25/2021: BUN 18; Creatinine, Ser 1.01; Hemoglobin 14.8; Platelets 167; Potassium 4.9; Sodium 138  ?Recent Lipid Panel ?No results found for: CHOL, TRIG, HDL, CHOLHDL, VLDL, LDLCALC, LDLDIRECT ? ?    ? ?Physical Exam:   ? ?VS:  BP 120/72   Pulse 75   Ht 5\' 7"  (1.702 m)   Wt 123 lb (55.8 kg)   SpO2 96%   BMI 19.26 kg/m?    ? ?Wt Readings from Last 3 Encounters:  ?07/08/21 123 lb (55.8 kg)  ?03/03/21 131 lb 12.8 oz (59.8 kg)  ?02/26/21 131 lb (59.4 kg)  ?  ? ?Gen: No distress   ?Neck: No JVD,  ?Ears: Dionicio Stall Sign ?Cardiac: No Rubs or Gallops, no Murmur, RRR +2 radial pulses ?Respiratory: Clear to auscultation bilaterally, normal effort, normal  respiratory rate ?GI: Soft, nontender, non-distended  ?MS: No  edema;  moves all extremities, R  radial site +2 no bruit or hematoma ?Integument: Skin feels  ?Neuro:  At time of evaluation, alert and oriented to person/place/time/situation  ?Psych: Normal affect, patient feels well ? ? ?ASSESSMENT:   ? ?1. Precordial pain   ?2. Coronary artery disease of native artery of native heart with stable angina pectoris ([place])   ?3. Raynaud's disease without gangrene   ? ?PLAN:   ? ?Coronary Artery Disease; Obstructive/Nonobstructive ?Carotid Artery disease ?Raynauds phemonenon ?- anatomy: isolated Diag disease ?- continue ASA 81 mg; Continue plavix; if worsening taste we could consider Brilnta instead ?- continue statin, goal LDL < 55 ?- continue nitrates as a prn ?- we have discussed repeat cath; no ekg changes, non toxic,, no really change in his pain from pre-cath, no smoking and taking his meds argue agent stent thrombosis ?- given  his history may have a component of microvascular disease; we would get MBFR and eval for diag disease with PET MPI ?- will start diltiazem 120 mg PO Daily and gave education on stopping for symptomatic hypotension ? ?Keep June appt ? ?   ? ?Shared Decision Making/Informed Consent ?The risks [chest pain, shortness of breath, cardiac arrhythmias, dizziness, blood pressure fluctuations, myocardial infarction, stroke/transient ischemic attack, nausea, vomiting, allergic reaction, radiation  exposure, metallic taste sensation and life-threatening complications (estimated to be 1 in 10,000)], benefits (risk stratification, diagnosing coronary artery disease, treatment guidance) and alternatives of a nuclear stress test were discussed in detail with Mr. Wedemeyer and he agrees to proceed.  ? ? ?Medication Adjustments/Labs and Tests Ordered: ?Current medicines are reviewed at length with the patient today.  Concerns regarding medicines are outlined above.  ?Orders Placed This Encounter  ?Procedures  ? NM PET CT CARDIAC PERFUSION MULTI W/ABSOLUTE BLOODFLOW  ? EKG 12-Lead  ? ?Meds ordered this encounter  ?Medications  ? diltiazem (CARDIZEM CD) 120 MG 24 hr capsule  ?  Sig: Take 1 capsule (120 mg total) by mouth daily.  ?  Dispense:  90 capsule  ?  Refill:  3  ? ? ?Patient Instructions  ?Medication Instructions:  ?Your physician has recommended you make the following change in your medication:  ?START: diltiazem 120 mg by mouth once daily ? ?*If you need a refill on your cardiac medications before your next appointment, please call your pharmacy* ? ? ?Lab Work: ?NONE ?If you have labs (blood work) drawn today and your tests are completely normal, you will receive your results only by: ?MyChart Message (if you have MyChart) OR ?A paper copy in the mail ?If you have any lab test that is abnormal or we need to change your treatment, we will call you to review the results. ? ? ?Testing/Procedures: ?Your physician has requested that  you have a Cardiac PET Scan. ? ? ?Follow-Up: As Scheduled ?At [facility], you and your health needs are our priority.  As part of our continuing mission to provide you with exceptional heart care, we have cr

## 2021-07-08 NOTE — Patient Instructions (Addendum)
Medication Instructions:  ?Your physician has recommended you make the following change in your medication:  ?START: diltiazem 120 mg by mouth once daily ? ?*If you need a refill on your cardiac medications before your next appointment, please call your pharmacy* ? ? ?Lab Work: ?NONE ?If you have labs (blood work) drawn today and your tests are completely normal, you will receive your results only by: ?MyChart Message (if you have MyChart) OR ?A paper copy in the mail ?If you have any lab test that is abnormal or we need to change your treatment, we will call you to review the results. ? ? ?Testing/Procedures: ?Your physician has requested that you have a Cardiac PET Scan. ? ? ?Follow-Up: As Scheduled ?At [facility], you and your health needs are our priority.  As part of our continuing mission to provide you with exceptional heart care, we have created designated Provider Care Teams.  These Care Teams include your primary Cardiologist (physician) and Advanced Practice Providers (APPs -  Physician Assistants and Nurse Practitioners) who all work together to provide you with the care you need, when you need it. ? ?Provider:   ?[name], MD   ? ? ?Other Instructions ? ? ? ? ?How to Prepare for Your Cardiac PET/CT Stress Test: ? ?1. Please do not take these medications before your test:  ? ?Medications that may interfere with the cardiac pharmacological stress agent (ex. nitrates or beta-blockers) the day of the exam. ?Theophylline containing medications for 12 hours. ?Dipyridamole 48 hours prior to the test. ?Your remaining medications may be taken with water. ? ?2. Nothing to eat or drink, except water, 3 hours prior to arrival time.   ?NO caffeine/decaffeinated products, or chocolate 12 hours prior to arrival. ? ?3. NO perfume, cologne or lotion ? ?4. Total time is 1 to 2 hours; you may want to bring reading material for the waiting time. ? ?5. Please report to Admitting at the [facility]  Entrance 60 minutes early for your test. ? [address] ? [place], [place] [address] ? ?Diabetic Preparation:  ?Hold oral medications. ?You may take NPH and Lantus insulin. ?Do not take Humalog or Humulin R (Regular Insulin) the day of your test. ?Check blood sugars prior to leaving the house. ?If able to eat breakfast prior to 3 hour fasting, you may take all medications, including your insulin, ?Do not worry if you miss your breakfast dose of insulin - start at your next meal. ? ?In preparation for your appointment, medication and supplies will be purchased.  Appointment availability is limited, so if you need to cancel or reschedule, please call the Radiology Department at [phone]  24 hours in advance to avoid a cancellation fee of $100.00 ? ?What to Expect After you Arrive: ? ?Once you arrive and check in for your appointment, you will be taken to a preparation room within the Radiology Department.  A technologist or Nurse will obtain your medical history, verify that you are correctly prepped for the exam, and explain the procedure.  Afterwards,  an IV will be started in your arm and electrodes will be placed on your skin for EKG monitoring during the stress portion of the exam. Then you will be escorted to the PET/CT scanner.  There, staff will get you positioned on the scanner and obtain a blood pressure and EKG.  During the exam, you will continue to be connected to the EKG and blood pressure machines.  A small, safe amount of  a radioactive tracer will be injected in your IV to obtain a series of pictures of your heart along with an injection of a stress agent.   ? ?After your Exam: ? ?It is recommended that you eat a meal and drink a caffeinated beverage to counter act any effects of the stress agent.  Drink plenty of fluids for the remainder of the day and urinate frequently for the first couple of hours after the exam.  Your doctor will inform you of your test results within 7-10 business  days. ? ?For questions about your test or how to prepare for your test, please call: ?Marchia Bond, Cardiac Imaging Nurse Navigator  ?Gordy Clement, Cardiac Imaging Nurse Navigator ?Office: [phone]  ? ?Important Information About Sugar ? ? ? ? ?  ?

## 2021-07-14 ENCOUNTER — Ambulatory Visit (HOSPITAL_COMMUNITY)
Admission: RE | Admit: 2021-07-14 | Discharge: 2021-07-14 | Disposition: A | Discharge status: Home / Self Care | Payer: Medicare Other | Source: Ambulatory Visit | Attending: Physician Assistant | Admitting: Physician Assistant

## 2021-07-14 ENCOUNTER — Ambulatory Visit: Payer: Medicare Other | Admitting: Physician Assistant

## 2021-07-14 VITALS — BP 119/70 | HR 69 | Temp 98.1°F | Resp 20 | Ht 67.0 in | Wt 125.0 lb

## 2021-07-14 DIAGNOSIS — M25551 Pain in right hip: Secondary | ICD-10-CM | POA: Insufficient documentation

## 2021-07-14 DIAGNOSIS — Z9889 Other specified postprocedural states: Secondary | ICD-10-CM

## 2021-07-14 DIAGNOSIS — R059 Cough, unspecified: Secondary | ICD-10-CM | POA: Insufficient documentation

## 2021-07-14 DIAGNOSIS — I6523 Occlusion and stenosis of bilateral carotid arteries: Secondary | ICD-10-CM

## 2021-07-14 DIAGNOSIS — K219 Gastro-esophageal reflux disease without esophagitis: Secondary | ICD-10-CM | POA: Insufficient documentation

## 2021-07-14 DIAGNOSIS — I739 Peripheral vascular disease, unspecified: Secondary | ICD-10-CM | POA: Insufficient documentation

## 2021-07-14 DIAGNOSIS — F419 Anxiety disorder, unspecified: Secondary | ICD-10-CM | POA: Insufficient documentation

## 2021-07-14 DIAGNOSIS — J309 Allergic rhinitis, unspecified: Secondary | ICD-10-CM | POA: Insufficient documentation

## 2021-07-14 DIAGNOSIS — I700 Atherosclerosis of aorta: Secondary | ICD-10-CM | POA: Insufficient documentation

## 2021-07-14 NOTE — Progress Notes (Signed)
?Office Note  ? ? ? ?CC:  follow up ?Requesting Provider:  Alroy Dust, L.Marlou Sa, MD ? ?HPI: Alex [name] is a 71 y.o. (10-26-1950) male who presents for surveillance of carotid artery stenosis.  He has history of right carotid endarterectomy in 2011 by Dr. Scot Dock.  He denies any diagnosis of CVA or TIA since last office visit.  He also denies any strokelike symptoms including slurring speech, changes in vision, or one-sided weakness.  He is a former smoker who previously smoked for about 47 years.  He recently underwent coronary stenting in December however continues to have shortness of breath and angina with exertion.  He has follow-up with cardiology this month. ? ?Patient also has weakness and cramping in his thighs and lower legs with walking uphill or upstairs.  He denies any rest pain or tissue loss.  He states the symptoms are tolerable and in no way impacts his day-to-day life. ? ? ?Past Medical History:  ?Diagnosis Date  ? Allergic rhinitis   ? Anxiety   ? Aortic atherosclerosis ([place])   ? Arthritis   ? Carotid artery occlusion   ? Cough   ? GERD (gastroesophageal reflux disease)   ? Hip pain   ? Hyperlipidemia   ? ? ?Past Surgical History:  ?Procedure Laterality Date  ? CAROTID ENDARTERECTOMY  July 02, 2009  ? Right cea  ? CORONARY STENT INTERVENTION N/A 02/26/2021  ? Procedure: CORONARY STENT INTERVENTION;  Surgeon: Jettie Booze, MD;  Location: [facility];  Service: Cardiovascular;  Laterality: N/A;  DIAG  ? EYE SURGERY  May  2011  ? Bilateral cataract  ? HERNIA REPAIR    ? LEFT HEART CATH AND CORONARY ANGIOGRAPHY N/A 02/26/2021  ? Procedure: LEFT HEART CATH AND CORONARY ANGIOGRAPHY;  Surgeon: Jettie Booze, MD;  Location: [facility];  Service: Cardiovascular;  Laterality: N/A;  ? ? ?Social History  ? ?Socioeconomic History  ? Marital status: Married  ?  Spouse name: Not on file  ? Number of children: Not on file  ? Years of education: Not on file  ? Highest education level:  Not on file  ?Occupational History  ? Not on file  ?Tobacco Use  ? Smoking status: Former  ?  Types: Cigarettes  ?  Quit date: 06/21/2005  ?  Years since quitting: 16.0  ?  Passive exposure: Never  ? Smokeless tobacco: Never  ?Substance and Sexual Activity  ? Alcohol use: Yes  ? Drug use: Yes  ? Sexual activity: Not on file  ?Other Topics Concern  ? Not on file  ?Social History Narrative  ? Not on file  ? ?Social Determinants of Health  ? ?Financial Resource Strain: Not on file  ?Food Insecurity: Not on file  ?Transportation Needs: Not on file  ?Physical Activity: Not on file  ?Stress: Not on file  ?Social Connections: Not on file  ?Intimate Partner Violence: Not on file  ? ? ?Family History  ?Problem Relation Age of Onset  ? Diabetes Brother   ? ? ?Current Outpatient Medications  ?Medication Sig Dispense Refill  ? acetaminophen (TYLENOL) 500 MG tablet Take 500 mg by mouth every 6 (six) hours as needed.    ? albuterol (VENTOLIN HFA) 108 (90 Base) MCG/ACT inhaler Inhale into the lungs every 6 (six) hours as needed for wheezing or shortness of breath.    ? aspirin 81 MG EC tablet Take 1 tablet (81 mg total) by mouth daily. 90 tablet 3  ?  atorvastatin (LIPITOR) 80 MG tablet Take 1 tablet (80 mg total) by mouth daily. 90 tablet 3  ? clonazePAM (KLONOPIN) 0.5 MG tablet Take 0.5 mg by mouth 2 (two) times daily as needed for anxiety.    ? clopidogrel (PLAVIX) 75 MG tablet Take 1 tablet (75 mg total) by mouth daily with breakfast. 90 tablet 3  ? diltiazem (CARDIZEM CD) 120 MG 24 hr capsule Take 1 capsule (120 mg total) by mouth daily. 90 capsule 3  ? doxycycline (VIBRAMYCIN) 100 MG capsule Take 100 mg by mouth 2 (two) times daily.    ? ibuprofen (ADVIL) 200 MG tablet Take 200 mg by mouth every 6 (six) hours as needed.    ? nitroGLYCERIN (NITROSTAT) 0.4 MG SL tablet Place 1 tablet (0.4 mg total) under the tongue every 5 (five) minutes as needed for chest pain. 25 tablet 6  ? pantoprazole (PROTONIX) 40 MG tablet Take 1 tablet  (40 mg total) by mouth daily. 90 tablet 3  ? ?No current facility-administered medications for this visit.  ? ? ?No Known Allergies ? ? ?REVIEW OF SYSTEMS:  ? ?[X]  denotes positive finding, [ ]  denotes negative finding ?Cardiac  Comments:  ?Chest pain or chest pressure:    ?Shortness of breath upon exertion:    ?Short of breath when lying flat:    ?Irregular heart rhythm:    ?    ?Vascular    ?Pain in calf, thigh, or hip brought on by ambulation:    ?Pain in feet at night that wakes you up from your sleep:     ?Blood clot in your veins:    ?Leg swelling:     ?    ?Pulmonary    ?Oxygen at home:    ?Productive cough:     ?Wheezing:     ?    ?Neurologic    ?Sudden weakness in arms or legs:     ?Sudden numbness in arms or legs:     ?Sudden onset of difficulty speaking or slurred speech:    ?Temporary loss of vision in one eye:     ?Problems with dizziness:     ?    ?Gastrointestinal    ?Blood in stool:     ?Vomited blood:     ?    ?Genitourinary    ?Burning when urinating:     ?Blood in urine:    ?    ?Psychiatric    ?Major depression:     ?    ?Hematologic    ?Bleeding problems:    ?Problems with blood clotting too easily:    ?    ?Skin    ?Rashes or ulcers:    ?    ?Constitutional    ?Fever or chills:    ? ? ?PHYSICAL EXAMINATION: ? ?Vitals:  ? 07/14/21 0923 07/14/21 0924  ?BP: 107/69 119/70  ?Pulse: 69   ?Resp: 20   ?Temp: 98.1 ?F (36.7 ?C)   ?TempSrc: Temporal   ?SpO2: 96%   ?Weight: 125 lb (56.7 kg)   ?Height: 5\' 7"  (1.702 m)   ? ? ?General:  WDWN in NAD; vital signs documented above ?Gait: Not observed ?HENT: WNL, normocephalic ?Pulmonary: normal non-labored breathing , without Rales, rhonchi,  wheezing ?Cardiac: regular HR ?Abdomen: soft, NT, no masses ?Skin: without rashes ?Vascular Exam/Pulses: ? Right Left  ?Radial 2+ (normal) 2+ (normal)  ? ?Extremities: without ischemic changes, without Gangrene , without cellulitis; without open wounds;  ?Musculoskeletal: no muscle wasting or atrophy  ?Neurologic:  A&O X 3;   No focal weakness or paresthesias are detected ?Psychiatric:  The pt has Normal affect. ? ? ?Non-Invasive Vascular Imaging:   ?B ICA 1-39% stenosis ? ? ? ?ASSESSMENT/PLAN:: 71 y.o. male here for follow up for surveillance of carotid artery stenosis ? ?-Duplex unchanged since last office visit demonstrating 1 to 39% stenosis of bilateral internal carotid arteries ?-Given history of carotid intervention and now recent coronary intervention.  Patient likely has PAD.  He does have endorse claudication symptoms however symptoms are very mild at this time and do not impact his day-to-day life.  He is also without any nonhealing wounds of bilateral lower extremities.  At some point we may need to evaluate patient for PAD however given his lack of symptoms at this time, no further work-up is necessary. ?-Continue aspirin and statin daily ?-Repeat carotid duplex in 2 years ? ? ?Dagoberto Ligas, PA-C ?[facility] ?[phone] ? ?Clinic MD:   Trula Slade ? ?

## 2021-07-21 ENCOUNTER — Telehealth (HOSPITAL_COMMUNITY): Payer: Self-pay | Admitting: *Deleted

## 2021-07-21 NOTE — Telephone Encounter (Signed)
Reaching out to patient to offer assistance regarding upcoming cardiac imaging study; pt verbalizes understanding of appt date/time, parking situation and where to check in, pre-test NPO status, and verified current allergies; name and call back number provided for further questions should they arise ? ?Gordy Clement RN Navigator Cardiac Imaging ?[facility] ?[phone] office ?[phone] cell ? ?Patient aware not to consume caffeine 12 ours prior to his scan. ?

## 2021-07-22 ENCOUNTER — Encounter (HOSPITAL_COMMUNITY)
Admission: RE | Admit: 2021-07-22 | Discharge: 2021-07-22 | Disposition: A | Discharge status: Home / Self Care | Payer: Medicare Other | Source: Ambulatory Visit | Attending: Internal Medicine | Admitting: Internal Medicine

## 2021-07-22 DIAGNOSIS — R072 Precordial pain: Secondary | ICD-10-CM | POA: Insufficient documentation

## 2021-07-22 MED ORDER — RUBIDIUM RB82 GENERATOR (RUBYFILL)
14.6100 | PACK | Freq: Once | INTRAVENOUS | Status: AC
  Administered 2021-07-22: 14.61 via INTRAVENOUS

## 2021-07-22 MED ORDER — RUBIDIUM RB82 GENERATOR (RUBYFILL)
14.7500 | PACK | Freq: Once | INTRAVENOUS | Status: AC
  Administered 2021-07-22: 14.75 via INTRAVENOUS

## 2021-07-22 MED ORDER — REGADENOSON 0.4 MG/5ML IV SOLN
INTRAVENOUS | Status: AC
  Administered 2021-07-22: 0.4 mg via INTRAVENOUS
  Filled 2021-07-22: qty 5

## 2021-07-23 ENCOUNTER — Telehealth: Payer: Self-pay

## 2021-07-23 DIAGNOSIS — R0602 Shortness of breath: Secondary | ICD-10-CM | POA: Diagnosis not present

## 2021-07-23 DIAGNOSIS — R911 Solitary pulmonary nodule: Secondary | ICD-10-CM

## 2021-07-23 DIAGNOSIS — J929 Pleural plaque without asbestos: Secondary | ICD-10-CM | POA: Diagnosis not present

## 2021-07-23 DIAGNOSIS — R079 Chest pain, unspecified: Secondary | ICD-10-CM | POA: Diagnosis not present

## 2021-07-23 DIAGNOSIS — R634 Abnormal weight loss: Secondary | ICD-10-CM | POA: Diagnosis not present

## 2021-07-23 LAB — NM PET CT CARDIAC PERFUSION MULTI W/ABSOLUTE BLOODFLOW
LV dias vol: 56 mL (ref 62–150)
LV sys vol: 12 mL
MBFR: 2.33
Nuc Rest EF: 71 %
Nuc Stress EF: 79 %
Peak HR: 112 {beats}/min
Rest HR: 70 {beats}/min
Rest MBF: 1.37 ml/g/min
Rest Nuclear Isotope Dose: 14.8 mCi
Rest perfusion cavity size (mL): 66 mL
ST Depression (mm): 0 mm
Stress MBF: 3.19 ml/g/min
Stress Nuclear Isotope Dose: 14.6 mCi
Stress perfusion cavity size (mL): 56 mL
TID: 0.74

## 2021-07-23 NOTE — Progress Notes (Signed)
Thanks [employer]. ?No new blockages by stress test. Normal LV function.  ?Please cc PCP regarding fidings concerning pneumonia

## 2021-07-23 NOTE — Telephone Encounter (Signed)
The patient has been notified of the result and verbalized understanding.  All questions (if any) were answered. ?Precious Gilding, RN 07/23/2021 2:50 PM   ?

## 2021-07-23 NOTE — Telephone Encounter (Signed)
-----   Message from Werner Lean, MD sent at 07/23/2021  1:03 PM EDT ----- ?Results: ?No evidence of worsening CAD ?Potential CAP  Pulm nodule ?Plan: ?Results to PCP ?Referral to Pulmonary nodule clinic ? ?Werner Lean, MD ? ?

## 2021-07-31 ENCOUNTER — Ambulatory Visit (INDEPENDENT_AMBULATORY_CARE_PROVIDER_SITE_OTHER): Payer: Medicare Other

## 2021-07-31 ENCOUNTER — Telehealth: Payer: Self-pay | Admitting: Pulmonary Disease

## 2021-07-31 ENCOUNTER — Encounter: Payer: Self-pay | Admitting: Pulmonary Disease

## 2021-07-31 ENCOUNTER — Ambulatory Visit: Payer: Medicare Other | Admitting: Pulmonary Disease

## 2021-07-31 VITALS — BP 112/64 | HR 88 | Temp 98.0°F | Ht 67.0 in | Wt 111.6 lb

## 2021-07-31 DIAGNOSIS — I739 Peripheral vascular disease, unspecified: Secondary | ICD-10-CM

## 2021-07-31 DIAGNOSIS — J90 Pleural effusion, not elsewhere classified: Secondary | ICD-10-CM | POA: Diagnosis not present

## 2021-07-31 MED ORDER — HYDROCODONE-ACETAMINOPHEN 5-325 MG PO TABS: 2.0000 | ORAL_TABLET | Freq: Four times a day (QID) | ORAL | 0 refills | Status: DC | PRN

## 2021-07-31 MED ORDER — TRELEGY ELLIPTA 100-62.5-25 MCG/ACT IN AEPB: 1.0000 | INHALATION_SPRAY | Freq: Every day | RESPIRATORY_TRACT | 3 refills | Status: DC

## 2021-07-31 NOTE — Patient Instructions (Signed)
We will schedule you for an ultrasound-guided thoracentesis-drainage of the pleural fluid ? ?A chest x-ray will be obtained soon after ? ?prescription sent in for Trelegy to be used daily ?Prescription for a pain pill sent in as well ? ?Continue using your albuterol as needed ? ?Tentative follow-up in 3 to 4 weeks ? ? ?

## 2021-07-31 NOTE — Telephone Encounter (Signed)
Please have the patient scheduled for a thoracentesis at [facility] as soon as possible. The provider prefers in the next week. Please advise.  ?

## 2021-07-31 NOTE — Progress Notes (Signed)
? ?      ?Alex [name]    [id number]    10-11-1950 ? ?Primary Care Physician:Mitchell, L.Marlou Sa, MD ? ?Referring Physician: Alroy Dust, L.Marlou Sa, MD ?[address] ?[address] ?[place],  [place] [address] ? ?Chief complaint:   ?Asked to see patient for pleural effusion ? ?HPI: ? ?Recently had a cardiac PET scan performed ?Revealed pleural effusion on the left with consolidation left lower lobe ? ?Patient stated he was treated with a course of antibiotics-doxycycline ? ?Per symptoms remain about the same ? ?Not really having any cough, no fever ?Does have some chest discomfort ? ?Exercise tolerance is poor ? ?Has lost about 15 pounds in the last 3 weeks ? ?Reformed smoker, quit about 15 years ago ? ?Outpatient Encounter Medications as of 07/31/2021  ?Medication Sig  ? acetaminophen (TYLENOL) 500 MG tablet Take 500 mg by mouth every 6 (six) hours as needed.  ? albuterol (VENTOLIN HFA) 108 (90 Base) MCG/ACT inhaler Inhale into the lungs every 6 (six) hours as needed for wheezing or shortness of breath.  ? aspirin 81 MG EC tablet Take 1 tablet (81 mg total) by mouth daily.  ? atorvastatin (LIPITOR) 80 MG tablet Take 1 tablet (80 mg total) by mouth daily.  ? clonazePAM (KLONOPIN) 0.5 MG tablet Take 0.5 mg by mouth 2 (two) times daily as needed for anxiety.  ? clopidogrel (PLAVIX) 75 MG tablet Take 1 tablet (75 mg total) by mouth daily with breakfast.  ? diltiazem (CARDIZEM CD) 120 MG 24 hr capsule Take 1 capsule (120 mg total) by mouth daily.  ? Fluticasone-Umeclidin-Vilant (TRELEGY ELLIPTA) 100-62.5-25 MCG/ACT AEPB Inhale 1 puff into the lungs daily.  ? ibuprofen (ADVIL) 200 MG tablet Take 200 mg by mouth every 6 (six) hours as needed.  ? nitroGLYCERIN (NITROSTAT) 0.4 MG SL tablet Place 1 tablet (0.4 mg total) under the tongue every 5 (five) minutes as needed for chest pain.  ? pantoprazole (PROTONIX) 40 MG tablet Take 1 tablet (40 mg total) by mouth daily.  ? [DISCONTINUED] HYDROcodone-acetaminophen (NORCO/VICODIN)  5-325 MG tablet Take 1 tablet by mouth every 6 (six) hours as needed for moderate pain.  ? HYDROcodone-acetaminophen (NORCO/VICODIN) 5-325 MG tablet Take 2 tablets by mouth every 6 (six) hours as needed for moderate pain.  ? [DISCONTINUED] doxycycline (VIBRAMYCIN) 100 MG capsule Take 100 mg by mouth 2 (two) times daily.  ? ?No facility-administered encounter medications on file as of 07/31/2021.  ? ? ?Allergies as of 07/31/2021  ? (No Known Allergies)  ? ? ?Past Medical History:  ?Diagnosis Date  ? Allergic rhinitis   ? Anxiety   ? Aortic atherosclerosis ([place])   ? Arthritis   ? Carotid artery occlusion   ? Cough   ? GERD (gastroesophageal reflux disease)   ? Hip pain   ? Hyperlipidemia   ? ? ?Past Surgical History:  ?Procedure Laterality Date  ? CAROTID ENDARTERECTOMY  July 02, 2009  ? Right cea  ? CORONARY STENT INTERVENTION N/A 02/26/2021  ? Procedure: CORONARY STENT INTERVENTION;  Surgeon: Jettie Booze, MD;  Location: [facility];  Service: Cardiovascular;  Laterality: N/A;  DIAG  ? EYE SURGERY  May  2011  ? Bilateral cataract  ? HERNIA REPAIR    ? LEFT HEART CATH AND CORONARY ANGIOGRAPHY N/A 02/26/2021  ? Procedure: LEFT HEART CATH AND CORONARY ANGIOGRAPHY;  Surgeon: Jettie Booze, MD;  Location: [facility];  Service: Cardiovascular;  Laterality: N/A;  ? ? ?Family History  ?  Problem Relation Age of Onset  ? Diabetes Brother   ? ? ?Social History  ? ?Socioeconomic History  ? Marital status: Married  ?  Spouse name: Not on file  ? Number of children: Not on file  ? Years of education: Not on file  ? Highest education level: Not on file  ?Occupational History  ? Not on file  ?Tobacco Use  ? Smoking status: Former  ?  Types: Cigarettes  ?  Quit date: 06/21/2005  ?  Years since quitting: 16.1  ?  Passive exposure: Never  ? Smokeless tobacco: Never  ?Substance and Sexual Activity  ? Alcohol use: Yes  ? Drug use: Yes  ? Sexual activity: Not on file  ?Other Topics Concern  ? Not on file  ?Social  History Narrative  ? Not on file  ? ?Social Determinants of Health  ? ?Financial Resource Strain: Not on file  ?Food Insecurity: Not on file  ?Transportation Needs: Not on file  ?Physical Activity: Not on file  ?Stress: Not on file  ?Social Connections: Not on file  ?Intimate Partner Violence: Not on file  ? ? ?Review of Systems  ?Constitutional:  Positive for fatigue.  ?Respiratory:  Positive for chest tightness and shortness of breath.   ? ?Vitals:  ? 07/31/21 1559  ?BP: 112/64  ?Pulse: 88  ?Temp: 98 ?F (36.7 ?C)  ?SpO2: 97%  ? ? ? ?Physical Exam ?Constitutional:   ?   Appearance: Normal appearance.  ?HENT:  ?   Head: Normocephalic.  ?   Mouth/Throat:  ?   Mouth: Mucous membranes are moist.  ?Cardiovascular:  ?   Rate and Rhythm: Normal rate and regular rhythm.  ?   Heart sounds: No murmur heard. ?  No friction rub.  ?Pulmonary:  ?   Effort: No respiratory distress.  ?   Breath sounds: No stridor. No wheezing or rhonchi.  ?Musculoskeletal:  ?   Cervical back: No rigidity or tenderness.  ?Neurological:  ?   Mental Status: He is alert.  ?Psychiatric:     ?   Mood and Affect: Mood normal.  ? ? ?Data Reviewed: ?PET scan on 07/22/2021 reviewed with the patient showing a left pleural effusion, consolidation in the left lower lobe ? ?Recent cardiac catheterization 02/26/2021 reviewed ? ?Chest x-ray 07/08/2021 did reveal very small pleural effusion on the left ? ?Assessment:  ?Left pleural effusion ? ?Left lower lobe consolidation-pneumonia ? 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 ?Shortness of breath on exertion ? ? ? ?Plan/Recommendations: ?Plan for ultrasound-guided thoracentesis ? ?Obtain chest x-ray today to assess for significant pleural effusion ? ?Acetaminophen-hydrocodone was refilled ? ?Starting patient on Trelegy 100 to be used daily ? ?Graded exercise as tolerated ? ?May require invasive intervention for left lower lobe infiltrate/consolidation if it is persistent despite  treatment for pneumonia ? ?He already completed a course of doxycycline ? ?Schedule for follow-up in 4 weeks ? ? ?Sherrilyn Rist MD ?[facility] ?07/31/2021, 4:38 PM ? ?CC: Mitchell, L.Marlou Sa, MD ? ? ?

## 2021-08-01 NOTE — Telephone Encounter (Signed)
Pt has been scheduled for 5/15.  I called and gave him appt info.  Nothing further needed. ?

## 2021-08-04 ENCOUNTER — Ambulatory Visit (HOSPITAL_COMMUNITY)
Admission: RE | Admit: 2021-08-04 | Discharge: 2021-08-04 | Disposition: A | Discharge status: Home / Self Care | Payer: Medicare Other | Source: Ambulatory Visit | Attending: Radiology | Admitting: Radiology

## 2021-08-04 ENCOUNTER — Ambulatory Visit (HOSPITAL_COMMUNITY)
Admission: RE | Admit: 2021-08-04 | Discharge: 2021-08-04 | Disposition: A | Discharge status: Home / Self Care | Payer: Medicare Other | Source: Ambulatory Visit | Attending: Pulmonary Disease | Admitting: Pulmonary Disease

## 2021-08-04 DIAGNOSIS — R140 Abdominal distension (gaseous): Secondary | ICD-10-CM | POA: Diagnosis not present

## 2021-08-04 DIAGNOSIS — C3432 Malignant neoplasm of lower lobe, left bronchus or lung: Secondary | ICD-10-CM | POA: Diagnosis not present

## 2021-08-04 DIAGNOSIS — Z87891 Personal history of nicotine dependence: Secondary | ICD-10-CM | POA: Diagnosis not present

## 2021-08-04 DIAGNOSIS — J189 Pneumonia, unspecified organism: Secondary | ICD-10-CM | POA: Diagnosis present

## 2021-08-04 DIAGNOSIS — K5903 Drug induced constipation: Secondary | ICD-10-CM | POA: Diagnosis not present

## 2021-08-04 DIAGNOSIS — B370 Candidal stomatitis: Secondary | ICD-10-CM | POA: Diagnosis not present

## 2021-08-04 DIAGNOSIS — M1991 Primary osteoarthritis, unspecified site: Secondary | ICD-10-CM | POA: Diagnosis not present

## 2021-08-04 DIAGNOSIS — Z955 Presence of coronary angioplasty implant and graft: Secondary | ICD-10-CM | POA: Diagnosis not present

## 2021-08-04 DIAGNOSIS — C3492 Malignant neoplasm of unspecified part of left bronchus or lung: Secondary | ICD-10-CM | POA: Diagnosis not present

## 2021-08-04 DIAGNOSIS — I739 Peripheral vascular disease, unspecified: Secondary | ICD-10-CM | POA: Diagnosis not present

## 2021-08-04 DIAGNOSIS — I2510 Atherosclerotic heart disease of native coronary artery without angina pectoris: Secondary | ICD-10-CM | POA: Diagnosis not present

## 2021-08-04 DIAGNOSIS — R1319 Other dysphagia: Secondary | ICD-10-CM | POA: Diagnosis not present

## 2021-08-04 DIAGNOSIS — K449 Diaphragmatic hernia without obstruction or gangrene: Secondary | ICD-10-CM | POA: Diagnosis not present

## 2021-08-04 DIAGNOSIS — E785 Hyperlipidemia, unspecified: Secondary | ICD-10-CM | POA: Diagnosis not present

## 2021-08-04 DIAGNOSIS — I700 Atherosclerosis of aorta: Secondary | ICD-10-CM | POA: Diagnosis not present

## 2021-08-04 DIAGNOSIS — Z7982 Long term (current) use of aspirin: Secondary | ICD-10-CM | POA: Diagnosis not present

## 2021-08-04 DIAGNOSIS — R091 Pleurisy: Secondary | ICD-10-CM | POA: Diagnosis not present

## 2021-08-04 DIAGNOSIS — R933 Abnormal findings on diagnostic imaging of other parts of digestive tract: Secondary | ICD-10-CM | POA: Diagnosis not present

## 2021-08-04 DIAGNOSIS — J440 Chronic obstructive pulmonary disease with acute lower respiratory infection: Secondary | ICD-10-CM | POA: Diagnosis not present

## 2021-08-04 DIAGNOSIS — R1310 Dysphagia, unspecified: Secondary | ICD-10-CM | POA: Diagnosis not present

## 2021-08-04 DIAGNOSIS — R918 Other nonspecific abnormal finding of lung field: Secondary | ICD-10-CM | POA: Diagnosis not present

## 2021-08-04 DIAGNOSIS — R079 Chest pain, unspecified: Secondary | ICD-10-CM | POA: Diagnosis not present

## 2021-08-04 DIAGNOSIS — Z681 Body mass index (BMI) 19 or less, adult: Secondary | ICD-10-CM | POA: Diagnosis not present

## 2021-08-04 DIAGNOSIS — J90 Pleural effusion, not elsewhere classified: Secondary | ICD-10-CM | POA: Insufficient documentation

## 2021-08-04 DIAGNOSIS — G893 Neoplasm related pain (acute) (chronic): Secondary | ICD-10-CM | POA: Diagnosis not present

## 2021-08-04 DIAGNOSIS — C3490 Malignant neoplasm of unspecified part of unspecified bronchus or lung: Secondary | ICD-10-CM | POA: Diagnosis not present

## 2021-08-04 DIAGNOSIS — R0789 Other chest pain: Secondary | ICD-10-CM | POA: Diagnosis not present

## 2021-08-04 DIAGNOSIS — F419 Anxiety disorder, unspecified: Secondary | ICD-10-CM | POA: Diagnosis present

## 2021-08-04 DIAGNOSIS — K5900 Constipation, unspecified: Secondary | ICD-10-CM | POA: Diagnosis not present

## 2021-08-04 DIAGNOSIS — R846 Abnormal cytological findings in specimens from respiratory organs and thorax: Secondary | ICD-10-CM | POA: Diagnosis not present

## 2021-08-04 DIAGNOSIS — Z7951 Long term (current) use of inhaled steroids: Secondary | ICD-10-CM | POA: Diagnosis not present

## 2021-08-04 DIAGNOSIS — R634 Abnormal weight loss: Secondary | ICD-10-CM | POA: Diagnosis not present

## 2021-08-04 DIAGNOSIS — E876 Hypokalemia: Secondary | ICD-10-CM | POA: Diagnosis not present

## 2021-08-04 DIAGNOSIS — R1311 Dysphagia, oral phase: Secondary | ICD-10-CM | POA: Diagnosis not present

## 2021-08-04 DIAGNOSIS — R109 Unspecified abdominal pain: Secondary | ICD-10-CM | POA: Diagnosis not present

## 2021-08-04 DIAGNOSIS — R222 Localized swelling, mass and lump, trunk: Secondary | ICD-10-CM | POA: Diagnosis not present

## 2021-08-04 DIAGNOSIS — I25118 Atherosclerotic heart disease of native coronary artery with other forms of angina pectoris: Secondary | ICD-10-CM | POA: Diagnosis not present

## 2021-08-04 DIAGNOSIS — J9859 Other diseases of mediastinum, not elsewhere classified: Secondary | ICD-10-CM | POA: Diagnosis not present

## 2021-08-04 DIAGNOSIS — R590 Localized enlarged lymph nodes: Secondary | ICD-10-CM | POA: Diagnosis not present

## 2021-08-04 DIAGNOSIS — K219 Gastro-esophageal reflux disease without esophagitis: Secondary | ICD-10-CM | POA: Diagnosis not present

## 2021-08-04 DIAGNOSIS — J449 Chronic obstructive pulmonary disease, unspecified: Secondary | ICD-10-CM | POA: Diagnosis not present

## 2021-08-04 DIAGNOSIS — Z7902 Long term (current) use of antithrombotics/antiplatelets: Secondary | ICD-10-CM | POA: Diagnosis not present

## 2021-08-04 DIAGNOSIS — K220 Achalasia of cardia: Secondary | ICD-10-CM | POA: Diagnosis not present

## 2021-08-04 DIAGNOSIS — E43 Unspecified severe protein-calorie malnutrition: Secondary | ICD-10-CM | POA: Diagnosis not present

## 2021-08-04 DIAGNOSIS — Z79899 Other long term (current) drug therapy: Secondary | ICD-10-CM | POA: Diagnosis not present

## 2021-08-04 DIAGNOSIS — K222 Esophageal obstruction: Secondary | ICD-10-CM | POA: Diagnosis not present

## 2021-08-04 DIAGNOSIS — C771 Secondary and unspecified malignant neoplasm of intrathoracic lymph nodes: Secondary | ICD-10-CM | POA: Diagnosis not present

## 2021-08-04 HISTORY — PX: IR THORACENTESIS ASP PLEURAL SPACE W/IMG GUIDE: IMG5380

## 2021-08-04 LAB — LACTATE DEHYDROGENASE, PLEURAL OR PERITONEAL FLUID: LD, Fluid: 511 U/L — ABNORMAL HIGH (ref 3–23)

## 2021-08-04 LAB — PROTEIN, PLEURAL OR PERITONEAL FLUID: Total protein, fluid: 4.1 g/dL

## 2021-08-04 LAB — GRAM STAIN: Gram Stain: NONE SEEN

## 2021-08-04 LAB — GLUCOSE, PLEURAL OR PERITONEAL FLUID: Glucose, Fluid: 108 mg/dL

## 2021-08-04 LAB — BODY FLUID CELL COUNT WITH DIFFERENTIAL
Eos, Fluid: 7 %
Lymphs, Fluid: 82 %
Monocyte-Macrophage-Serous Fluid: 11 % — ABNORMAL LOW (ref 50–90)
Neutrophil Count, Fluid: 0 % (ref 0–25)
Total Nucleated Cell Count, Fluid: 2340 cu mm — ABNORMAL HIGH (ref 0–1000)

## 2021-08-04 MED ORDER — LIDOCAINE HCL 1 % IJ SOLN
INTRAMUSCULAR | Status: AC
  Administered 2021-08-04: 10 mL
  Filled 2021-08-04: qty 20

## 2021-08-05 ENCOUNTER — Emergency Department (HOSPITAL_BASED_OUTPATIENT_CLINIC_OR_DEPARTMENT_OTHER): Payer: Medicare Other

## 2021-08-05 ENCOUNTER — Telehealth: Payer: Self-pay | Admitting: Pulmonary Disease

## 2021-08-05 ENCOUNTER — Inpatient Hospital Stay (HOSPITAL_BASED_OUTPATIENT_CLINIC_OR_DEPARTMENT_OTHER)
Admission: EM | Admit: 2021-08-05 | Discharge: 2021-08-15 | DRG: 180 | Disposition: A | Discharge status: Home Health | Payer: Medicare Other | Attending: Student | Admitting: Student

## 2021-08-05 ENCOUNTER — Other Ambulatory Visit: Payer: Self-pay

## 2021-08-05 ENCOUNTER — Encounter (HOSPITAL_BASED_OUTPATIENT_CLINIC_OR_DEPARTMENT_OTHER): Payer: Self-pay | Admitting: Obstetrics and Gynecology

## 2021-08-05 ENCOUNTER — Emergency Department (HOSPITAL_BASED_OUTPATIENT_CLINIC_OR_DEPARTMENT_OTHER): Payer: Medicare Other | Admitting: Radiology

## 2021-08-05 DIAGNOSIS — K5903 Drug induced constipation: Secondary | ICD-10-CM | POA: Diagnosis not present

## 2021-08-05 DIAGNOSIS — R222 Localized swelling, mass and lump, trunk: Secondary | ICD-10-CM

## 2021-08-05 DIAGNOSIS — Z7902 Long term (current) use of antithrombotics/antiplatelets: Secondary | ICD-10-CM | POA: Diagnosis not present

## 2021-08-05 DIAGNOSIS — C3490 Malignant neoplasm of unspecified part of unspecified bronchus or lung: Secondary | ICD-10-CM | POA: Diagnosis not present

## 2021-08-05 DIAGNOSIS — I2510 Atherosclerotic heart disease of native coronary artery without angina pectoris: Secondary | ICD-10-CM | POA: Diagnosis present

## 2021-08-05 DIAGNOSIS — R1310 Dysphagia, unspecified: Secondary | ICD-10-CM | POA: Diagnosis not present

## 2021-08-05 DIAGNOSIS — K222 Esophageal obstruction: Secondary | ICD-10-CM | POA: Diagnosis not present

## 2021-08-05 DIAGNOSIS — C3432 Malignant neoplasm of lower lobe, left bronchus or lung: Principal | ICD-10-CM | POA: Diagnosis present

## 2021-08-05 DIAGNOSIS — C771 Secondary and unspecified malignant neoplasm of intrathoracic lymph nodes: Secondary | ICD-10-CM | POA: Diagnosis present

## 2021-08-05 DIAGNOSIS — E785 Hyperlipidemia, unspecified: Secondary | ICD-10-CM | POA: Diagnosis present

## 2021-08-05 DIAGNOSIS — J9859 Other diseases of mediastinum, not elsewhere classified: Secondary | ICD-10-CM | POA: Diagnosis present

## 2021-08-05 DIAGNOSIS — Z955 Presence of coronary angioplasty implant and graft: Secondary | ICD-10-CM

## 2021-08-05 DIAGNOSIS — K449 Diaphragmatic hernia without obstruction or gangrene: Secondary | ICD-10-CM | POA: Diagnosis present

## 2021-08-05 DIAGNOSIS — J90 Pleural effusion, not elsewhere classified: Secondary | ICD-10-CM | POA: Diagnosis not present

## 2021-08-05 DIAGNOSIS — K220 Achalasia of cardia: Secondary | ICD-10-CM | POA: Diagnosis present

## 2021-08-05 DIAGNOSIS — C3492 Malignant neoplasm of unspecified part of left bronchus or lung: Secondary | ICD-10-CM | POA: Diagnosis not present

## 2021-08-05 DIAGNOSIS — R0789 Other chest pain: Secondary | ICD-10-CM | POA: Diagnosis not present

## 2021-08-05 DIAGNOSIS — F419 Anxiety disorder, unspecified: Secondary | ICD-10-CM | POA: Diagnosis present

## 2021-08-05 DIAGNOSIS — Z79899 Other long term (current) drug therapy: Secondary | ICD-10-CM

## 2021-08-05 DIAGNOSIS — K219 Gastro-esophageal reflux disease without esophagitis: Secondary | ICD-10-CM | POA: Diagnosis present

## 2021-08-05 DIAGNOSIS — Z7951 Long term (current) use of inhaled steroids: Secondary | ICD-10-CM | POA: Diagnosis not present

## 2021-08-05 DIAGNOSIS — R1319 Other dysphagia: Secondary | ICD-10-CM

## 2021-08-05 DIAGNOSIS — J4489 Other specified chronic obstructive pulmonary disease: Secondary | ICD-10-CM

## 2021-08-05 DIAGNOSIS — R590 Localized enlarged lymph nodes: Secondary | ICD-10-CM | POA: Diagnosis not present

## 2021-08-05 DIAGNOSIS — R634 Abnormal weight loss: Secondary | ICD-10-CM | POA: Diagnosis not present

## 2021-08-05 DIAGNOSIS — Z681 Body mass index (BMI) 19 or less, adult: Secondary | ICD-10-CM

## 2021-08-05 DIAGNOSIS — E43 Unspecified severe protein-calorie malnutrition: Secondary | ICD-10-CM | POA: Insufficient documentation

## 2021-08-05 DIAGNOSIS — K5900 Constipation, unspecified: Secondary | ICD-10-CM | POA: Diagnosis present

## 2021-08-05 DIAGNOSIS — E876 Hypokalemia: Secondary | ICD-10-CM | POA: Diagnosis present

## 2021-08-05 DIAGNOSIS — R140 Abdominal distension (gaseous): Secondary | ICD-10-CM | POA: Diagnosis not present

## 2021-08-05 DIAGNOSIS — R079 Chest pain, unspecified: Secondary | ICD-10-CM | POA: Diagnosis present

## 2021-08-05 DIAGNOSIS — R933 Abnormal findings on diagnostic imaging of other parts of digestive tract: Secondary | ICD-10-CM | POA: Diagnosis not present

## 2021-08-05 DIAGNOSIS — I25118 Atherosclerotic heart disease of native coronary artery with other forms of angina pectoris: Secondary | ICD-10-CM | POA: Diagnosis present

## 2021-08-05 DIAGNOSIS — J440 Chronic obstructive pulmonary disease with acute lower respiratory infection: Secondary | ICD-10-CM | POA: Diagnosis present

## 2021-08-05 DIAGNOSIS — R109 Unspecified abdominal pain: Secondary | ICD-10-CM | POA: Diagnosis not present

## 2021-08-05 DIAGNOSIS — B370 Candidal stomatitis: Secondary | ICD-10-CM

## 2021-08-05 DIAGNOSIS — J189 Pneumonia, unspecified organism: Secondary | ICD-10-CM | POA: Diagnosis present

## 2021-08-05 DIAGNOSIS — R1311 Dysphagia, oral phase: Secondary | ICD-10-CM | POA: Diagnosis not present

## 2021-08-05 DIAGNOSIS — J449 Chronic obstructive pulmonary disease, unspecified: Secondary | ICD-10-CM | POA: Diagnosis not present

## 2021-08-05 DIAGNOSIS — M1991 Primary osteoarthritis, unspecified site: Secondary | ICD-10-CM | POA: Diagnosis present

## 2021-08-05 DIAGNOSIS — G893 Neoplasm related pain (acute) (chronic): Secondary | ICD-10-CM | POA: Diagnosis present

## 2021-08-05 DIAGNOSIS — Z7982 Long term (current) use of aspirin: Secondary | ICD-10-CM

## 2021-08-05 DIAGNOSIS — I739 Peripheral vascular disease, unspecified: Secondary | ICD-10-CM | POA: Diagnosis not present

## 2021-08-05 DIAGNOSIS — R918 Other nonspecific abnormal finding of lung field: Secondary | ICD-10-CM | POA: Diagnosis not present

## 2021-08-05 DIAGNOSIS — I700 Atherosclerosis of aorta: Secondary | ICD-10-CM | POA: Diagnosis not present

## 2021-08-05 DIAGNOSIS — Z87891 Personal history of nicotine dependence: Secondary | ICD-10-CM

## 2021-08-05 LAB — CBC WITH DIFFERENTIAL/PLATELET
Abs Immature Granulocytes: 0.04 10*3/uL (ref 0.00–0.07)
Basophils Absolute: 0.1 10*3/uL (ref 0.0–0.1)
Basophils Relative: 1 %
Eosinophils Absolute: 0.4 10*3/uL (ref 0.0–0.5)
Eosinophils Relative: 3 %
HCT: 41.3 % (ref 39.0–52.0)
Hemoglobin: 13.7 g/dL (ref 13.0–17.0)
Immature Granulocytes: 0 %
Lymphocytes Relative: 10 %
Lymphs Abs: 1.3 10*3/uL (ref 0.7–4.0)
MCH: 29.2 pg (ref 26.0–34.0)
MCHC: 33.2 g/dL (ref 30.0–36.0)
MCV: 88.1 fL (ref 80.0–100.0)
Monocytes Absolute: 1 10*3/uL (ref 0.1–1.0)
Monocytes Relative: 7 %
Neutro Abs: 10.3 10*3/uL — ABNORMAL HIGH (ref 1.7–7.7)
Neutrophils Relative %: 79 %
Platelets: 278 10*3/uL (ref 150–400)
RBC: 4.69 MIL/uL (ref 4.22–5.81)
RDW: 12.8 % (ref 11.5–15.5)
WBC: 13.1 10*3/uL — ABNORMAL HIGH (ref 4.0–10.5)
nRBC: 0 % (ref 0.0–0.2)

## 2021-08-05 LAB — COMPREHENSIVE METABOLIC PANEL
ALT: 19 U/L (ref 0–44)
AST: 20 U/L (ref 15–41)
Albumin: 4.1 g/dL (ref 3.5–5.0)
Alkaline Phosphatase: 104 U/L (ref 38–126)
Anion gap: 13 (ref 5–15)
BUN: 21 mg/dL (ref 8–23)
CO2: 31 mmol/L (ref 22–32)
Calcium: 10 mg/dL (ref 8.9–10.3)
Chloride: 92 mmol/L — ABNORMAL LOW (ref 98–111)
Creatinine, Ser: 0.83 mg/dL (ref 0.61–1.24)
GFR, Estimated: >60 mL/min (ref >60)
Glucose, Bld: 116 mg/dL — ABNORMAL HIGH (ref 70–99)
Potassium: 3.2 mmol/L — ABNORMAL LOW (ref 3.5–5.1)
Sodium: 136 mmol/L (ref 135–145)
Total Bilirubin: 0.9 mg/dL (ref 0.3–1.2)
Total Protein: 7.7 g/dL (ref 6.5–8.1)

## 2021-08-05 LAB — TYPE AND SCREEN
ABO/RH(D): O POS
Antibody Screen: NEGATIVE

## 2021-08-05 LAB — ACID FAST SMEAR (AFB, MYCOBACTERIA): Acid Fast Smear: NEGATIVE

## 2021-08-05 LAB — TROPONIN I (HIGH SENSITIVITY)
Troponin I (High Sensitivity): 4 ng/L (ref <18)
Troponin I (High Sensitivity): 4 ng/L (ref <18)

## 2021-08-05 LAB — LIPASE, BLOOD: Lipase: 18 U/L (ref 11–51)

## 2021-08-05 MED ORDER — IOHEXOL 350 MG/ML SOLN
100.0000 mL | Freq: Once | INTRAVENOUS | Status: AC | PRN
  Administered 2021-08-05: 75 mL via INTRAVENOUS

## 2021-08-05 MED ORDER — NITROGLYCERIN 0.4 MG SL SUBL: 0.4000 mg | SUBLINGUAL_TABLET | SUBLINGUAL | Status: DC | PRN

## 2021-08-05 MED ORDER — MORPHINE SULFATE (PF) 2 MG/ML IV SOLN
1.0000 mg | Freq: Once | INTRAVENOUS | Status: AC
  Administered 2021-08-05: 1 mg via INTRAVENOUS
  Filled 2021-08-05: qty 1

## 2021-08-05 MED ORDER — CLONAZEPAM 0.5 MG PO TABS
0.5000 mg | ORAL_TABLET | Freq: Two times a day (BID) | ORAL | Status: DC | PRN
  Administered 2021-08-11 – 2021-08-12 (×2): 0.5 mg via ORAL
  Filled 2021-08-05 (×2): qty 1

## 2021-08-05 MED ORDER — SODIUM CHLORIDE 0.9 % IV SOLN
1.0000 g | Freq: Once | INTRAVENOUS | Status: AC
  Administered 2021-08-05: 1 g via INTRAVENOUS
  Filled 2021-08-05: qty 10

## 2021-08-05 MED ORDER — LORAZEPAM 2 MG/ML IJ SOLN
0.5000 mg | Freq: Once | INTRAMUSCULAR | Status: AC
  Administered 2021-08-05: 0.5 mg via INTRAVENOUS
  Filled 2021-08-05: qty 1

## 2021-08-05 MED ORDER — MORPHINE SULFATE (PF) 2 MG/ML IV SOLN
2.0000 mg | Freq: Once | INTRAVENOUS | Status: AC
  Administered 2021-08-05: 2 mg via INTRAVENOUS
  Filled 2021-08-05: qty 1

## 2021-08-05 MED ORDER — ALBUTEROL SULFATE (2.5 MG/3ML) 0.083% IN NEBU: 2.5000 mg | INHALATION_SOLUTION | RESPIRATORY_TRACT | Status: DC | PRN

## 2021-08-05 MED ORDER — ALBUTEROL SULFATE HFA 108 (90 BASE) MCG/ACT IN AERS: 2.0000 | INHALATION_SPRAY | RESPIRATORY_TRACT | Status: DC | PRN

## 2021-08-05 MED ORDER — FLUTICASONE FUROATE-VILANTEROL 100-25 MCG/ACT IN AEPB
1.0000 | INHALATION_SPRAY | Freq: Every day | RESPIRATORY_TRACT | Status: DC
  Administered 2021-08-06 – 2021-08-15 (×8): 1 via RESPIRATORY_TRACT
  Filled 2021-08-05: qty 28

## 2021-08-05 MED ORDER — ASPIRIN 81 MG PO TBEC
81.0000 mg | DELAYED_RELEASE_TABLET | Freq: Every day | ORAL | Status: DC
  Administered 2021-08-06 – 2021-08-07 (×2): 81 mg via ORAL
  Filled 2021-08-05 (×2): qty 1

## 2021-08-05 MED ORDER — SODIUM CHLORIDE 0.9 % IV SOLN
2.0000 g | INTRAVENOUS | Status: AC
  Administered 2021-08-06 – 2021-08-12 (×7): 2 g via INTRAVENOUS
  Filled 2021-08-05 (×7): qty 20

## 2021-08-05 MED ORDER — DILTIAZEM HCL ER COATED BEADS 120 MG PO CP24
120.0000 mg | ORAL_CAPSULE | Freq: Every day | ORAL | Status: DC
  Administered 2021-08-06 – 2021-08-15 (×10): 120 mg via ORAL
  Filled 2021-08-05 (×10): qty 1

## 2021-08-05 MED ORDER — PANTOPRAZOLE SODIUM 40 MG PO TBEC
40.0000 mg | DELAYED_RELEASE_TABLET | Freq: Every day | ORAL | Status: DC
  Administered 2021-08-06 – 2021-08-15 (×10): 40 mg via ORAL
  Filled 2021-08-05 (×10): qty 1

## 2021-08-05 MED ORDER — MORPHINE SULFATE (PF) 2 MG/ML IV SOLN
1.0000 mg | INTRAVENOUS | Status: DC | PRN
  Administered 2021-08-05 – 2021-08-14 (×21): 1 mg via INTRAVENOUS
  Filled 2021-08-05 (×21): qty 1

## 2021-08-05 MED ORDER — FENTANYL CITRATE PF 50 MCG/ML IJ SOSY
25.0000 ug | PREFILLED_SYRINGE | Freq: Once | INTRAMUSCULAR | Status: AC
  Administered 2021-08-05: 25 ug via INTRAVENOUS
  Filled 2021-08-05: qty 1

## 2021-08-05 MED ORDER — AZITHROMYCIN 500 MG IV SOLR
500.0000 mg | INTRAVENOUS | Status: DC
  Administered 2021-08-06 – 2021-08-10 (×5): 500 mg via INTRAVENOUS
  Filled 2021-08-05 (×6): qty 5

## 2021-08-05 MED ORDER — IOHEXOL 350 MG/ML SOLN: 75.0000 mL | Freq: Once | INTRAVENOUS | Status: DC | PRN

## 2021-08-05 MED ORDER — SODIUM CHLORIDE 0.9 % IV SOLN
500.0000 mg | Freq: Once | INTRAVENOUS | Status: AC
  Administered 2021-08-05: 500 mg via INTRAVENOUS
  Filled 2021-08-05: qty 5

## 2021-08-05 MED ORDER — UMECLIDINIUM BROMIDE 62.5 MCG/ACT IN AEPB
1.0000 | INHALATION_SPRAY | Freq: Every day | RESPIRATORY_TRACT | Status: DC
  Administered 2021-08-06 – 2021-08-15 (×8): 1 via RESPIRATORY_TRACT
  Filled 2021-08-05 (×2): qty 7

## 2021-08-05 MED ORDER — ATORVASTATIN CALCIUM 40 MG PO TABS
80.0000 mg | ORAL_TABLET | Freq: Every day | ORAL | Status: DC
  Administered 2021-08-06 – 2021-08-15 (×9): 80 mg via ORAL
  Filled 2021-08-05 (×9): qty 2

## 2021-08-05 NOTE — ED Notes (Signed)
Patient off the floor at this time with [facility] for transfer. Patient's spouse and son at bedside ?

## 2021-08-05 NOTE — Telephone Encounter (Signed)
Record reviewed ?

## 2021-08-05 NOTE — Telephone Encounter (Signed)
Pt followed by Dr. Ander Slade for COPD with emphysema. ? ?He was seen in office on 07/31/21.  He had pneumonia with Lt pleural effusion.  He was started on antibiotics.  He had left thoracentesis done with IR on 08/04/21 with 550 ml fluid removed.  Fluid analysis consistent with an exudate (LDH 511, protein 5.1, glucose 108, WBC 2340 with 82% lymphocytes, gram stain negative).  After he returned home he started feeling pain in his chest.  He now feels this on both sides of his chest.  He feel short of breath and when he tries to inhale he feels like his breathing is being cut off.  He has a cough with pink tinged sputum.  He hasn't felt feverish.  He doesn't have a means to check his vital signs at home. ? ?I explained the concerns at this time are: 1) pneumothorax, 2) recurrence of pleural effusion, 3) re-expansion pulmonary edema.  Explained that he needs to get a chest xray and based on result additional interventions would be determined.  As such therapies he would likely need could not be provided in an outpatient setting.  Advised him to go to the emergency room for further assessment, and then the ER can contact PCCM as needed. ? ?He expressed understanding of this plan and agreed to go to the emergency room. ? ?Will route message to Dr. Ander Slade and Triage to be aware of plan. ?

## 2021-08-05 NOTE — ED Provider Notes (Signed)
?[facility] ?Provider Note ? ? ?CSN: [id number] ?Arrival date & time: 08/05/21  0848 ? ?  ? ?History ? ?Chief Complaint  ?Patient presents with  ? Chest Pain  ? ? ?Landon Bassford Wisler is a 71 y.o. male. ? ?HPI ?71 yo male ho cad, recent pleural effusion, s/p thoracentesis by IR yesterday.  Reports 550 cc fluid removed and no real relief.  Patient has had dyspnea and weight loss over past month.  He was on doxycyline but has completed that course approximately 12 days ago.  He reports pain in the left lateral lower chest.  It is severe it is paroxysmal but does stay and sharp when he has it often. ? ?  ? ?Home Medications ?Prior to Admission medications   ?Medication Sig Start Date End Date Taking? Authorizing Provider  ?albuterol (VENTOLIN HFA) 108 (90 Base) MCG/ACT inhaler Inhale into the lungs every 6 (six) hours as needed for wheezing or shortness of breath.   Yes [provider]  ?aspirin 81 MG EC tablet Take 1 tablet (81 mg total) by mouth daily. 02/26/21 02/26/22 Yes Jettie Booze, MD  ?atorvastatin (LIPITOR) 80 MG tablet Take 1 tablet (80 mg total) by mouth daily. 02/28/21 02/28/22 Yes Jettie Booze, MD  ?clopidogrel (PLAVIX) 75 MG tablet Take 1 tablet (75 mg total) by mouth daily with breakfast. 02/28/21  Yes Jettie Booze, MD  ?diltiazem (CARDIZEM CD) 120 MG 24 hr capsule Take 1 capsule (120 mg total) by mouth daily. 07/08/21  Yes Chandrasekhar, Mahesh A, MD  ?Fluticasone-Umeclidin-Vilant (TRELEGY ELLIPTA) 100-62.5-25 MCG/ACT AEPB Inhale 1 puff into the lungs daily. 07/31/21  Yes Olalere, Adewale A, MD  ?HYDROcodone-acetaminophen (NORCO/VICODIN) 5-325 MG tablet Take 2 tablets by mouth every 6 (six) hours as needed for moderate pain. 07/31/21  Yes Olalere, Adewale A, MD  ?nitroGLYCERIN (NITROSTAT) 0.4 MG SL tablet Place 1 tablet (0.4 mg total) under the tongue every 5 (five) minutes as needed for chest pain. 02/25/21  Yes Jettie Booze, MD  ?pantoprazole  (PROTONIX) 40 MG tablet Take 1 tablet (40 mg total) by mouth daily. 02/28/21 02/28/22 Yes Jettie Booze, MD  ?acetaminophen (TYLENOL) 500 MG tablet Take 500 mg by mouth every 6 (six) hours as needed.    [provider]  ?clonazePAM (KLONOPIN) 0.5 MG tablet Take 0.5 mg by mouth 2 (two) times daily as needed for anxiety.    [provider]  ?ibuprofen (ADVIL) 200 MG tablet Take 200 mg by mouth every 6 (six) hours as needed.    [provider]  ?   ? ?Allergies    ?Patient has no known allergies.   ? ?Review of Systems   ?Review of Systems  ?Constitutional:  Positive for activity change, fatigue and unexpected weight change.  ?HENT: Negative.    ?Eyes: Negative.   ?Respiratory:  Positive for cough and shortness of breath.   ?Cardiovascular:  Positive for chest pain. Negative for leg swelling.  ?Gastrointestinal:  Positive for abdominal pain.  ?Endocrine: Negative.   ?Genitourinary: Negative.   ?Musculoskeletal: Negative.   ?Allergic/Immunologic: Negative.   ?Neurological: Negative.   ?Hematological: Negative.   ?Psychiatric/Behavioral:  Positive for agitation.   ? ?Physical Exam ?Updated Vital Signs ?BP (!) 151/85   Pulse 88   Temp 98.1 ?F (36.7 ?C) (Oral)   Resp (!) 21   Ht 1.702 m (5\' 7" )   Wt 49.8 kg   SpO2 98%   BMI 17.20 kg/m?  ?Physical Exam ?Vitals and nursing  note reviewed.  ?Constitutional:   ?   General: He is not in acute distress. ?   Appearance: He is well-developed. He is not ill-appearing.  ?   Comments: Thin male  ?HENT:  ?   Head: Normocephalic.  ?Eyes:  ?   Pupils: Pupils are equal, round, and reactive to light.  ?Cardiovascular:  ?   Rate and Rhythm: Normal rate and regular rhythm.  ?   Heart sounds: Normal heart sounds.  ?Pulmonary:  ?   Effort: Pulmonary effort is normal. No respiratory distress.  ?   Breath sounds: Normal breath sounds. No decreased breath sounds, wheezing or rhonchi.  ?Chest:  ?   Chest wall: No mass, deformity, tenderness or crepitus.   ?Abdominal:  ?   Palpations: Abdomen is soft.  ?Musculoskeletal:     ?   General: Normal range of motion.  ?   Cervical back: Normal range of motion.  ?   Right lower leg: No tenderness. No edema.  ?   Left lower leg: No tenderness. No edema.  ?Skin: ?   General: Skin is warm and dry.  ?   Capillary Refill: Capillary refill takes less than 2 seconds.  ?Neurological:  ?   General: No focal deficit present.  ?   Mental Status: He is alert.  ? ? ?ED Results / Procedures / Treatments   ?Labs ?(all labs ordered are listed, but only abnormal results are displayed) ?Labs Reviewed  ?CBC WITH DIFFERENTIAL/PLATELET - Abnormal; Notable for the following components:  ?    Result Value  ? WBC 13.1 (*)   ? Neutro Abs 10.3 (*)   ? All other components within normal limits  ?COMPREHENSIVE METABOLIC PANEL - Abnormal; Notable for the following components:  ? Potassium 3.2 (*)   ? Chloride 92 (*)   ? Glucose, Bld 116 (*)   ? All other components within normal limits  ?CULTURE, BLOOD (ROUTINE X 2)  ?CULTURE, BLOOD (ROUTINE X 2)  ?LIPASE, BLOOD  ?TROPONIN I (HIGH SENSITIVITY)  ?TROPONIN I (HIGH SENSITIVITY)  ? ? ?EKG ?EKG Interpretation ? ?Date/Time:  Tuesday Aug 05 2021 09:03:44 EDT ?Ventricular Rate:  91 ?PR Interval:  153 ?QRS Duration: 109 ?QT Interval:  372 ?QTC Calculation: 458 ?R Axis:   -80 ?Text Interpretation: Sinus rhythm Left atrial enlargement Left axis deviation RSR' in V1 or V2, probably normal variant Abnormal inferior Q waves Confirmed by Pattricia Boss [phone]) on 08/05/2021 9:07:26 AM ? ?Radiology ?DG Chest 1 View ? ?Result Date: 08/04/2021 ?CLINICAL DATA:  Post left thoracentesis.  550 cc removed. EXAM: CHEST  1 VIEW COMPARISON:  07/31/2021 and PET 07/22/2021. FINDINGS: Trachea is midline. Heart size normal. Small residual left pleural effusion status post left thoracentesis. Difficult to excluded a crescentic collection of loculated pleural air at the base of the left hemithorax. Left lower lobe opacification. Lungs  are otherwise clear. IMPRESSION: 1. Difficult to exclude loculated pleural air at the base of the left hemithorax, status post left thoracentesis. Small residual left pleural effusion. 2. Left lower lobe subtotal consolidation may be due to pneumonia. Electronically Signed   By: Lorin Picket M.D.   On: 08/04/2021 10:22  ? ?DG Chest 2 View ? ?Result Date: 08/05/2021 ?CLINICAL DATA:  Recent thoracentesis with 500 cc removed. LEFT-sided pain. EXAM: CHEST - 2 VIEW COMPARISON:  08/04/2021, 07/31/2021 FINDINGS: Normal cardiac silhouette. Lungs are hyperinflated. Trace LEFT effusion remains. No pneumothorax. LEFT basilar atelectasis. IMPRESSION: 1. No interval change from 1 day prior. 2. Trace  LEFT effusion and LEFT basilar atelectasis. 3. No pneumothorax. Electronically Signed   By: Suzy Bouchard M.D.   On: 08/05/2021 09:52  ? ?CT Angio Chest PE W and/or Wo Contrast ? ?Result Date: 08/05/2021 ?CLINICAL DATA:  Chest pain.  Left thoracentesis yesterday. EXAM: CT ANGIOGRAPHY CHEST CT ABDOMEN AND PELVIS WITH CONTRAST TECHNIQUE: Multidetector CT imaging of the chest was performed using the standard protocol during bolus administration of intravenous contrast. Multiplanar CT image reconstructions and MIPs were obtained to evaluate the vascular anatomy. Multidetector CT imaging of the abdomen and pelvis was performed using the standard protocol during bolus administration of intravenous contrast. RADIATION DOSE REDUCTION: This exam was performed according to the departmental dose-optimization program which includes automated exposure control, adjustment of the mA and/or kV according to patient size and/or use of iterative reconstruction technique. CONTRAST:  8mL OMNIPAQUE IOHEXOL 350 MG/ML SOLN COMPARISON:  Chest x-Kamren Heskett from same day. Cardiac PET-CT dated Jul 22, 2021. FINDINGS: CTA CHEST FINDINGS Cardiovascular: Satisfactory opacification of the pulmonary arteries to the segmental level. No evidence of pulmonary embolism.  Normal heart size. No pericardial effusion. No thoracic aortic aneurysm or dissection. Coronary, aortic arch, and branch vessel atherosclerotic vascular disease. Mediastinum/Nodes: Mildly enlarged left hilar and ill-

## 2021-08-05 NOTE — Progress Notes (Addendum)
Plan of Care Note for accepted transfer ? ? ?Patient: GLENDA KUNST MRN: [id number]   DOA: 08/05/2021 ? ?Facility requesting transfer: [facility] ?Requesting Provider: Jeanell Sparrow ?Reason for transfer:  mass ?Facility course: Patient with h/o CAD and pleural effusion that was drained by IR yesterday, presenting with CP.  Has been treated as an outpatient for PNA.  H/o remote tobacco use.  550 cc drained by IR yesterday.  Pulm sent to ER for evaluation.  +productive cough, 30 lb weight loss.  No hypoxia, no fever.  CT chest with mass encircling aorta.  CT surgery called and recommends hospitalist admission.  This is not a resectable mass per CT surgery.  Needs admission to progressive with pulm consult upon arrival.   ? ? ?Plan of care: ?The patient is accepted for admission to Progressive unit, at [facility]. ? ?Author: ?Karmen Bongo, MD ?08/05/2021 ? ?Check [email or web address] for on-call coverage. ? ?Nursing staff, Please call [facility] number on Amion as soon as patient's arrival, so appropriate admitting provider can evaluate the pt. ?

## 2021-08-05 NOTE — ED Notes (Signed)
Report Called to [facility] Nurse ?

## 2021-08-05 NOTE — H&P (Signed)
?History and Physical  ? ? ?Alex [name] TFT:[id number] DOB: [birth year] DOA: 08/05/2021 ? ?PCP: Alroy Dust, L.Marlou Sa, MD  ?Patient coming from: Home. ? ?Chief Complaint: Chest pain. ? ?HPI: Alex [name] is a 71 y.o. male with history of CAD status post stenting in December 2022 on dual antiplatelet agents has been experiencing chest pain for some time now and had work-up done by cardiologist including a PET scan which showed pleural effusion and consolidation underwent thoracentesis 2 days ago following which patient's chest pain worsened and patient presents to the med center with complaint of chest pain. ? 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  ER physician discussed with CT surgery at [facility] felt that patient is not a candidate for surgery patient transferred to [facility] for further work-up.  In addition there also was concern for pneumonia. ? ?Review of Systems: As per HPI, rest all negative. ? ? ?Past Medical History:  ?Diagnosis Date  ? Allergic rhinitis   ? Anxiety   ? Aortic atherosclerosis ([place])   ? Arthritis   ? Carotid artery occlusion   ? Cough   ? GERD (gastroesophageal reflux disease)   ? Hip pain   ? Hyperlipidemia   ? ? ?Past Surgical History:  ?Procedure Laterality Date  ? CAROTID ENDARTERECTOMY  July 02, 2009  ? Right cea  ? CORONARY STENT INTERVENTION N/A 02/26/2021  ? Procedure: CORONARY STENT INTERVENTION;  Surgeon: Jettie Booze, MD;  Location: [facility];  Service: Cardiovascular;  Laterality: N/A;  DIAG  ? EYE SURGERY  May  2011  ? Bilateral cataract  ? HERNIA REPAIR    ? IR THORACENTESIS ASP PLEURAL SPACE W/IMG GUIDE  08/04/2021  ? LEFT HEART CATH AND CORONARY ANGIOGRAPHY N/A 02/26/2021  ? Procedure: LEFT HEART CATH AND CORONARY ANGIOGRAPHY;  Surgeon: Jettie Booze, MD;  Location: [facility];  Service: Cardiovascular;  Laterality: N/A;  ? ? ?  reports that he quit smoking about 16 years ago. His smoking use included cigarettes. He has never been exposed to tobacco smoke. He has never used smokeless tobacco. He reports current alcohol use. He reports current drug use. ? ?No Known Allergies ? ?Family History  ?Problem Relation Age of Onset  ? Diabetes Brother   ? ? ?Prior to Admission medications   ?Medication Sig Start Date End Date Taking? Authorizing Provider  ?albuterol (VENTOLIN HFA) 108 (90 Base) MCG/ACT inhaler Inhale into the lungs every 6 (six) hours as needed for wheezing or shortness of breath.   Yes [provider]  ?aspirin 81 MG EC tablet Take 1 tablet (81 mg total) by mouth daily. 02/26/21 02/26/22 Yes Jettie Booze, MD  ?atorvastatin (LIPITOR) 80 MG tablet Take 1 tablet (80 mg total) by mouth daily. 02/28/21 02/28/22 Yes Jettie Booze, MD  ?clopidogrel (PLAVIX) 75 MG tablet Take 1 tablet (75 mg total) by mouth daily with breakfast. 02/28/21  Yes Jettie Booze, MD  ?diltiazem (CARDIZEM CD) 120 MG 24 hr capsule Take 1 capsule (120 mg total) by mouth daily. 07/08/21  Yes Chandrasekhar, Mahesh A, MD  ?Fluticasone-Umeclidin-Vilant (TRELEGY ELLIPTA) 100-62.5-25 MCG/ACT AEPB Inhale 1 puff into the lungs daily. 07/31/21  Yes Olalere, Adewale A, MD  ?HYDROcodone-acetaminophen (NORCO/VICODIN) 5-325 MG tablet Take 2 tablets by mouth every 6 (six) hours as needed for moderate pain. 07/31/21  Yes Olalere, Adewale A, MD  ?nitroGLYCERIN (NITROSTAT) 0.4  MG SL tablet Place 1 tablet (0.4 mg total) under the tongue every 5 (five) minutes as needed for chest pain. 02/25/21  Yes Jettie Booze, MD  ?pantoprazole (PROTONIX) 40 MG tablet Take 1 tablet (40 mg total) by mouth daily. 02/28/21 02/28/22 Yes Jettie Booze, MD  ?acetaminophen (TYLENOL) 500 MG tablet Take 500 mg by mouth every 6 (six) hours as needed.    [provider]  ?clonazePAM (KLONOPIN) 0.5 MG tablet Take 0.5 mg by mouth 2 (two) times daily as needed for  anxiety.    [provider]  ?ibuprofen (ADVIL) 200 MG tablet Take 200 mg by mouth every 6 (six) hours as needed.    [provider]  ? ? ?Physical Exam: ?Constitutional: Moderately built and nourished. ?Vitals:  ? 08/05/21 1615 08/05/21 1700 08/05/21 1826 08/05/21 1827  ?BP:  (!) 147/79 139/80   ?Pulse: 92 91 90   ?Resp: 15 13    ?Temp:   97.8 ?F (36.6 ?C)   ?TempSrc:   Oral   ?SpO2: 96% 97% 96%   ?Weight:    48.6 kg  ?Height:    5\' 7"  (1.702 m)  ? ?Eyes: Anicteric no pallor. ?ENMT: No discharge from ears eyes nose and mouth. ?Neck: No mass felt.  No neck rigidity. ?Respiratory: No rhonchi or crepitations. ?Cardiovascular: S1-S2 heard. ?Abdomen: Soft nontender bowel sounds present. ?Musculoskeletal: No edema. ?Skin: No rash. ?Neurologic: Alert awake oriented time place and person.  Moves all extremities. ?Psychiatric: Appears normal.  Normal affect. ? ? ?Labs on Admission: I have personally reviewed following labs and imaging studies ? ?CBC: ?Recent Labs  ?Lab 08/05/21 ?0929  ?WBC 13.1*  ?NEUTROABS 10.3*  ?HGB 13.7  ?HCT 41.3  ?MCV 88.1  ?PLT 278  ? ?Basic Metabolic Panel: ?Recent Labs  ?Lab 08/05/21 ?0929  ?NA 136  ?K 3.2*  ?CL 92*  ?CO2 31  ?GLUCOSE 116*  ?BUN 21  ?CREATININE 0.83  ?CALCIUM 10.0  ? ?GFR: ?Estimated Creatinine Clearance: 55.3 mL/min (by C-G formula based on SCr of 0.83 mg/dL). ?Liver Function Tests: ?Recent Labs  ?Lab 08/05/21 ?0929  ?AST 20  ?ALT 19  ?ALKPHOS 104  ?BILITOT 0.9  ?PROT 7.7  ?ALBUMIN 4.1  ? ?Recent Labs  ?Lab 08/05/21 ?0929  ?LIPASE 18  ? ?No results for input(s): AMMONIA in the last 168 hours. ?Coagulation Profile: ?No results for input(s): INR, PROTIME in the last 168 hours. ?Cardiac Enzymes: ?No results for input(s): CKTOTAL, CKMB, CKMBINDEX, TROPONINI in the last 168 hours. ?BNP (last 3 results) ?No results for input(s): PROBNP in the last 8760 hours. ?HbA1C: ?No results for input(s): HGBA1C in the last 72 hours. ?CBG: ?No results for input(s): GLUCAP in the  last 168 hours. ?Lipid Profile: ?No results for input(s): CHOL, HDL, LDLCALC, TRIG, CHOLHDL, LDLDIRECT in the last 72 hours. ?Thyroid Function Tests: ?No results for input(s): TSH, T4TOTAL, FREET4, T3FREE, THYROIDAB in the last 72 hours. ?Anemia Panel: ?No results for input(s): VITAMINB12, FOLATE, FERRITIN, TIBC, IRON, RETICCTPCT in the last 72 hours. ?Urine analysis: ?   ?Component Value Date/Time  ? [place] 07/01/2009 1234  ? APPEARANCEUR CLEAR 07/01/2009 1234  ? LABSPEC 1.017 07/01/2009 1234  ? PHURINE 7.5 07/01/2009 1234  ? GLUCOSEU NEGATIVE 07/01/2009 1234  ? HGBUR NEGATIVE 07/01/2009 1234  ? [place] NEGATIVE 07/01/2009 1234  ? [place] NEGATIVE 07/01/2009 1234  ? PROTEINUR NEGATIVE 07/01/2009 1234  ? UROBILINOGEN 1.0 07/01/2009 1234  ? NITRITE NEGATIVE 07/01/2009 1234  ? LEUKOCYTESUR  07/01/2009 1234  ?  NEGATIVE MICROSCOPIC NOT DONE ON URINES WITH NEGATIVE PROTEIN, BLOOD, LEUKOCYTES, NITRITE, OR GLUCOSE <1000 mg/dL.  ? ?Sepsis Labs: ?@LABRCNTIP (procalcitonin:4,lacticidven:4) ?) ?Recent Results (from the past 240 hour(s))  ?Gram stain     Status: None  ? Collection Time: 08/04/21 10:33 AM  ? Specimen: PATH Cytology Pleural fluid  ?Result Value Ref Range Status  ? Specimen Description PLEURAL FLUID  Final  ? Special Requests NONE  Final  ? Gram Stain   Final  ?  NO WBC SEEN ?NO ORGANISMS SEEN ?Performed at [facility], [place] [address] [place], [place] [address] ?  ? Report Status 08/04/2021 FINAL  Final  ?Acid Fast Smear (AFB)     Status: None  ? Collection Time: 08/04/21 10:33 AM  ? Specimen: PATH Cytology Pleural fluid  ?Result Value Ref Range Status  ? AFB Specimen Processing Concentration  Final  ? Acid Fast Smear Negative  Final  ?  Comment: (NOTE) ?Performed At: [facility] ?[address] [place], [place] [id number] ?Rush Farmer MD [id number] ?  ? Source (AFB) PLEURAL  Final  ?  Comment: FLUID ?Performed at [facility], [place] [address], [place], [place]  [address] ?  ?  ? ?Radiological Exams on Admission: ?DG Chest 1 View ? ?Result Date: 08/04/2021 ?CLINICAL DATA:  Post left thoracentesis.  550 cc removed. EXAM: CHEST  1 VIEW COMPARISON:  07/31/2021 and PET 07/22/2021. F

## 2021-08-05 NOTE — ED Triage Notes (Signed)
Patient reports to the ER for chest pain. Patient reports he had fluid drawn off his lungs yesterday and was hoping that would help his recurrent chest pain but states he is still having severe pain and discomfort.  ?

## 2021-08-06 DIAGNOSIS — R222 Localized swelling, mass and lump, trunk: Secondary | ICD-10-CM | POA: Diagnosis not present

## 2021-08-06 DIAGNOSIS — E43 Unspecified severe protein-calorie malnutrition: Secondary | ICD-10-CM | POA: Insufficient documentation

## 2021-08-06 LAB — COMPREHENSIVE METABOLIC PANEL
ALT: 24 U/L (ref 0–44)
AST: 25 U/L (ref 15–41)
Albumin: 3.6 g/dL (ref 3.5–5.0)
Alkaline Phosphatase: 91 U/L (ref 38–126)
Anion gap: 11 (ref 5–15)
BUN: 20 mg/dL (ref 8–23)
CO2: 29 mmol/L (ref 22–32)
Calcium: 9.1 mg/dL (ref 8.9–10.3)
Chloride: 96 mmol/L — ABNORMAL LOW (ref 98–111)
Creatinine, Ser: 0.81 mg/dL (ref 0.61–1.24)
GFR, Estimated: >60 mL/min (ref >60)
Glucose, Bld: 119 mg/dL — ABNORMAL HIGH (ref 70–99)
Potassium: 3.4 mmol/L — ABNORMAL LOW (ref 3.5–5.1)
Sodium: 136 mmol/L (ref 135–145)
Total Bilirubin: 1 mg/dL (ref 0.3–1.2)
Total Protein: 6.9 g/dL (ref 6.5–8.1)

## 2021-08-06 LAB — CBC
HCT: 40.1 % (ref 39.0–52.0)
Hemoglobin: 13.2 g/dL (ref 13.0–17.0)
MCH: 29.3 pg (ref 26.0–34.0)
MCHC: 32.9 g/dL (ref 30.0–36.0)
MCV: 89.1 fL (ref 80.0–100.0)
Platelets: 268 10*3/uL (ref 150–400)
RBC: 4.5 MIL/uL (ref 4.22–5.81)
RDW: 12.5 % (ref 11.5–15.5)
WBC: 13.3 10*3/uL — ABNORMAL HIGH (ref 4.0–10.5)
nRBC: 0 % (ref 0.0–0.2)

## 2021-08-06 MED ORDER — BOOST / RESOURCE BREEZE PO LIQD CUSTOM
1.0000 | Freq: Three times a day (TID) | ORAL | Status: DC
Start: 2021-08-06 — End: 2021-08-15
  Administered 2021-08-06 – 2021-08-11 (×4): 1 via ORAL

## 2021-08-06 MED ORDER — ADULT MULTIVITAMIN W/MINERALS CH
1.0000 | ORAL_TABLET | Freq: Every day | ORAL | Status: DC
  Administered 2021-08-06 – 2021-08-15 (×9): 1 via ORAL
  Filled 2021-08-06 (×9): qty 1

## 2021-08-06 MED ORDER — POLYETHYLENE GLYCOL 3350 17 G PO PACK
17.0000 g | PACK | Freq: Two times a day (BID) | ORAL | Status: DC
  Administered 2021-08-06 – 2021-08-15 (×12): 17 g via ORAL
  Filled 2021-08-06 (×16): qty 1

## 2021-08-06 MED ORDER — POTASSIUM CHLORIDE 20 MEQ PO PACK
40.0000 meq | PACK | Freq: Once | ORAL | Status: AC
  Administered 2021-08-06: 40 meq via ORAL
  Filled 2021-08-06: qty 2

## 2021-08-06 MED ORDER — SENNOSIDES-DOCUSATE SODIUM 8.6-50 MG PO TABS
1.0000 | ORAL_TABLET | Freq: Two times a day (BID) | ORAL | Status: DC
  Administered 2021-08-06 – 2021-08-15 (×15): 1 via ORAL
  Filled 2021-08-06 (×16): qty 1

## 2021-08-06 MED ORDER — ENSURE ENLIVE PO LIQD
237.0000 mL | Freq: Two times a day (BID) | ORAL | Status: DC
  Administered 2021-08-12: 237 mL via ORAL

## 2021-08-06 MED ORDER — CLOPIDOGREL BISULFATE 75 MG PO TABS
75.0000 mg | ORAL_TABLET | Freq: Every day | ORAL | Status: DC
  Administered 2021-08-06: 75 mg via ORAL
  Filled 2021-08-06: qty 1

## 2021-08-06 MED ORDER — ONDANSETRON HCL 4 MG/2ML IJ SOLN
4.0000 mg | Freq: Four times a day (QID) | INTRAMUSCULAR | Status: DC | PRN
  Administered 2021-08-06 – 2021-08-09 (×5): 4 mg via INTRAVENOUS
  Filled 2021-08-06 (×5): qty 2

## 2021-08-06 NOTE — Progress Notes (Signed)
Initial Nutrition Assessment ? ?DOCUMENTATION CODES:  ? ?Severe malnutrition in context of chronic illness, Underweight ? ?INTERVENTION:  ?- Liberalize diet from a heart healthy to a regular diet to provide widest variety of menu options to enhance nutritional adequacy ? ?- Boost Breeze po TID, each supplement provides 250 kcal and 9 grams of protein ? ?- Magic cup TID with meals, each supplement provides 290 kcal and 9 grams of protein ? ?- MVI with minerals daily ? ?- Check micronutrient labs to detect for deficiencies given poor PO intake: Zinc, Vitamin D, anemia panel ? ?- Would recommend initiation of tube feeding if pt continues with poor PO intake given malnutrition ? ?NUTRITION DIAGNOSIS:  ? ?Severe Malnutrition related to chronic illness (CAD, aortic atherosclerosis) as evidenced by percent weight loss, severe fat depletion, severe muscle depletion. ? ?GOAL:  ? ?Patient will meet greater than or equal to 90% of their needs ? ?MONITOR:  ? ?PO intake, Supplement acceptance, Diet advancement, Labs, Weight trends ? ?REASON FOR ASSESSMENT:  ? ?Malnutrition Screening Tool ?  ? ?ASSESSMENT:  ? ?Pt admitted with chest pain. PMH significant for CAD s/p stenting 02/2021, anxiety, aortic atherosclerosis, carotid artery occlusion, GERD, and HLD. ? ?Noted CT angiogram concerning for bronchogenic carcinoma with metastasis as well as concern for PNA.  ? ?Pt with wife present at bedside. He reports having very poor PO intake within the last 3 weeks which seemed sudden. He has been able to tolerate some soups and liquids. His wife reports that prior to these past 3 weeks, he would eat often between 12P-7P. Pt states that foods do not sound appetizing to him and have begun tasting off and is unable to tolerate his favorite foods. Pt's wife purchased Ensure for him which he was previously drinking but recently states that his taste for them has changed and does not think he can tolerate milk type nutrition supplements. Pt  appeared hesitant but agreed to try Boost Breeze.  ? ?Meal completions: ?0% x 1 recorded meal ? ?Spoke with pt about checking labs for possible micronutrient deficiencies as he reports changes in taste and significantly poor PO intake. He is agreeable.  ? ?Pt used to walk 1-4 miles a day until recently and has noticed a significant decline in his mobility d/t increase in chest pain and difficulty breathing.  ? ?Pt states that he weighed 129 lbs and has had about a 20 lbs weight loss within the last month. Reviewed weight history. Pt noted to have had a 19% weight loss within the last 6 months (12/12-05/16) which is clinically significant for time frame.  ? ?Pt noted to have severe malnutrition. Suspect pt is unlikely to meet nutritional needs via oral intake alone. Spoke with pt about thoughts on tube feeding if he continues with inadequate PO intake. This is not something he would like to consider at this time. Encouraged pt to eat frequently throughout the day and order foods that he feels he may tolerate well, prioritizing protein. He would benefit from a liberalized diet and addition of nutrition supplements to encourage adequate nutritional intake. Spoke with MD regarding recommendation of enteral nutrition, no plans for tube feeding at this time. ? ?Medications: protonix, IV abx ? ?Labs: potassium 3.4 (replacing) ? ?NUTRITION - FOCUSED PHYSICAL EXAM: ? ?Flowsheet Row Most Recent Value  ?Orbital Region Severe depletion  ?Upper Arm Region Severe depletion  ?Thoracic and Lumbar Region Severe depletion  ?Buccal Region Severe depletion  ?Temple Region Moderate depletion  ?Clavicle Bone Region  Severe depletion  ?Clavicle and Acromion Bone Region Severe depletion  ?Scapular Bone Region Severe depletion  ?Dorsal Hand Moderate depletion  ?Patellar Region Severe depletion  ?Anterior Thigh Region Severe depletion  ?Posterior Calf Region Severe depletion  ?Edema (RD Assessment) None  ?Hair Reviewed  ?Eyes Reviewed  ?Mouth  Reviewed  ?Skin Reviewed  ?Nails Reviewed  ? ?  ? ?Diet Order:   ?Diet Order   ? ?       ?  Diet regular Room service appropriate? Yes; Fluid consistency: Thin  Diet effective now       ?  ? ?  ?  ? ?  ? ?EDUCATION NEEDS:  ? ?Education needs have been addressed ? ?Skin:  Skin Assessment: Reviewed RN Assessment ? ?Last BM:  unknown ? ?Height:  ? ?Ht Readings from Last 1 Encounters:  ?08/05/21 5\' 7"  (1.702 m)  ? ? ?Weight:  ? ?Wt Readings from Last 1 Encounters:  ?08/05/21 48.6 kg  ? ?BMI:  Body mass index is 16.77 kg/m?. ? ?Estimated Nutritional Needs:  ? ?Kcal:  1700-1900 ? ?Protein:  85-100g ? ?Fluid:  >/=1.7L ? ?Clayborne Dana, RDN, LDN ?Clinical Nutrition ?

## 2021-08-06 NOTE — Consult Note (Addendum)
? ?NAME:  Alex [name], MRN:  [id number], DOB:  10-01-1950, LOS: 1 ?ADMISSION DATE:  08/05/2021, CONSULTATION DATE:  08/06/2021 ?REFERRING MD:  Niel Hummer, CHIEF COMPLAINT:  Lung pass with pleural effusion   ? ?History of Present Illness:  ?Alex [name] is a 71 y.o. make with a PMH significant for CAD S/P PCI with DAPT started 02/2021, GERD, HLD, anxiety, and arthritis who presented to [facility] ED for complaints of chest pain. Of note patient has been experience chest pain for several weeks now and cardiology scheduled cardiac pet scan that revealed pleural effusion, this was tapped with thoracentesis 5/15 (cytology remains pending)  ? ?Patient states about a year ago he began feeling more fatigued and deconditioned with worsening symptoms December 2022.  At that time patient was evaluated and found to have CAD requiring PCI and stent placement.  Patient states he felt better for about a week but symptoms began to return prompting reevaluation by cardiology.  Patient states he has lost about 20 pounds in the last 3 weeks and believes this is likely secondary to decreased oral intake as he has experienced episodes of " food getting stuck" resulting in fear for oral intake. ? ?On ED arrival patient was hemodynamically stable> he underwent CTA of chest/ABD/Pelvis with was concerning for mass concerning for bronchogenic carcinoma with mets. Given concern for new lung cancer ED reached out to CTS, images reviewed and no surgical interventions seen. PCCM was consulted for possible need of bronch.  ? ?Pertinent  Medical History  ? CAD S/P PCI with DAPT started 02/2021, GERD, HLD, anxiety, and arthritis ? ?Significant Hospital Events: ?Including procedures, antibiotic start and stop dates in addition to other pertinent events   ?5/16 Admitted with chest pain CTA concerning for new lung mass  ?5/17 PCCM consulted  ? ?Interim History / Subjective:  ?As above ? ?Objective   ?Blood pressure 111/69, pulse 99, temperature  98.4 ?F (36.9 ?C), temperature source Oral, resp. rate 16, height 5\' 7"  (1.702 m), weight 48.6 kg, SpO2 95 %. ?   ?   ? ?Intake/Output Summary (Last 24 hours) at 08/06/2021 1250 ?Last data filed at 08/06/2021 0900 ?Gross per 24 hour  ?Intake 710 ml  ?Output --  ?Net 710 ml  ? ?Filed Weights  ? 08/05/21 0901 08/05/21 1827  ?Weight: 49.8 kg 48.6 kg  ? ? ?Examination: ?General: Acute on chronic ill-appearing elderly male lying in bed in no acute distress ?HEENT: [place]/AT, MM pink/moist, PERRL,  ?Neuro: Alert and oriented x3, nonfocal ?CV: s1s2 regular rate and rhythm, no murmur, rubs, or gallops,  ?PULM: Clear to auscultation bilaterally, no increased work of breathing, no added breath sounds, on room air ?GI: soft, bowel sounds active in all 4 quadrants, non-tender, non-distended, tolerating oral diet ?Extremities: warm/dry, no edema  ?Skin: no rashes or lesions ? ?Resolved Hospital Problem list   ? ? 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  However per chart review patient underwent cardiac PET scan 5/2 with no evidence of mass at that time ?-Last dose of Plavix 5/17 ?Left pleural effusion ?-Patient underwent thoracentesis 5/15 with 500 mL of hazy bloody tinged fluid removed ?P: ?Hold home Plavix for need of bronchoscopy with likely biopsy ?Follow cytology ?Encourage pulmonary hygiene ?Consider SLP evaluation ?Mobilize as able ? ? ?Best Practice (right click and "  Reselect all SmartList Selections" daily)  ? ?Diet/type: Regular consistency (see orders) ?DVT prophylaxis: SCD ?GI prophylaxis: PPI ?Lines: N/A ?Foley:  N/A ?Code Status:  full code ?Last date of multidisciplinary goals of care discussion: Continue to update patient and family daily ? ?Labs   ?CBC: ?Recent Labs  ?Lab 08/05/21 ?0929 08/06/21 ?0402  ?WBC 13.1* 13.3*  ?NEUTROABS  10.3*  --   ?HGB 13.7 13.2  ?HCT 41.3 40.1  ?MCV 88.1 89.1  ?PLT 278 268  ? ? ?Basic Metabolic Panel: ?Recent Labs  ?Lab 08/05/21 ?0929 08/06/21 ?0402  ?NA 136 136  ?K 3.2* 3.4*  ?CL 92* 96*  ?CO2 31 29  ?GLUCOSE 116* 119*  ?BUN 21 20  ?CREATININE 0.83 0.81  ?CALCIUM 10.0 9.1  ? ?GFR: ?Estimated Creatinine Clearance: 56.7 mL/min (by C-G formula based on SCr of 0.81 mg/dL). ?Recent Labs  ?Lab 08/05/21 ?0929 08/06/21 ?0402  ?WBC 13.1* 13.3*  ? ? ?Liver Function Tests: ?Recent Labs  ?Lab 08/05/21 ?0929 08/06/21 ?0402  ?AST 20 25  ?ALT 19 24  ?ALKPHOS 104 91  ?BILITOT 0.9 1.0  ?PROT 7.7 6.9  ?ALBUMIN 4.1 3.6  ? ?Recent Labs  ?Lab 08/05/21 ?0929  ?LIPASE 18  ? ?No results for input(s): AMMONIA in the last 168 hours. ? ?ABG ?No results found for: PHART, PCO2ART, PO2ART, HCO3, TCO2, ACIDBASEDEF, O2SAT  ? ?Coagulation Profile: ?No results for input(s): INR, PROTIME in the last 168 hours. ? ?Cardiac Enzymes: ?No results for input(s): CKTOTAL, CKMB, CKMBINDEX, TROPONINI in the last 168 hours. ? ?HbA1C: ?No results found for: HGBA1C ? ?CBG: ?No results for input(s): GLUCAP in the last 168 hours. ? ?Review of Systems:   ?Please see the history of present illness. All other systems reviewed and are negative  ? ?Past Medical History:  ?He,  has a past medical history of Allergic rhinitis, Anxiety, Aortic atherosclerosis ([place]), Arthritis, Carotid artery occlusion, Cough, GERD (gastroesophageal reflux disease), Hip pain, and Hyperlipidemia.  ? ?Surgical History:  ? ?Past Surgical History:  ?Procedure Laterality Date  ? CAROTID ENDARTERECTOMY  July 02, 2009  ? Right cea  ? CORONARY STENT INTERVENTION N/A 02/26/2021  ? Procedure: CORONARY STENT INTERVENTION;  Surgeon: Jettie Booze, MD;  Location: [facility];  Service: Cardiovascular;  Laterality: N/A;  DIAG  ? EYE SURGERY  May  2011  ? Bilateral cataract  ? HERNIA REPAIR    ? IR THORACENTESIS ASP PLEURAL SPACE W/IMG GUIDE  08/04/2021  ? LEFT HEART CATH AND CORONARY  ANGIOGRAPHY N/A 02/26/2021  ? Procedure: LEFT HEART CATH AND CORONARY ANGIOGRAPHY;  Surgeon: Jettie Booze, MD;  Location: [facility];  Service: Cardiovascular;  Laterality: N/A;  ?  ? ?Social History:  ? reports that he quit smoking about 16 years ago. His smoking use included cigarettes. He has never been exposed to tobacco smoke. He has never used smokeless tobacco. He reports current alcohol use. He reports current drug use.  ? ?Family History:  ?His family history includes Diabetes in his brother.  ? ?Allergies ?No Known Allergies  ? ?Home Medications  ?Prior to Admission medications   ?Medication Sig Start Date End Date Taking? Authorizing Provider  ?albuterol (VENTOLIN HFA) 108 (90 Base) MCG/ACT inhaler Inhale into the lungs every 6 (six) hours as needed for wheezing or shortness of breath.   Yes [provider]  ?aspirin 81 MG EC tablet Take 1 tablet (81 mg total) by mouth daily. 02/26/21 02/26/22 Yes Jettie Booze, MD  ?atorvastatin (  LIPITOR) 80 MG tablet Take 1 tablet (80 mg total) by mouth daily. 02/28/21 02/28/22 Yes Jettie Booze, MD  ?clopidogrel (PLAVIX) 75 MG tablet Take 1 tablet (75 mg total) by mouth daily with breakfast. 02/28/21  Yes Jettie Booze, MD  ?diltiazem (CARDIZEM CD) 120 MG 24 hr capsule Take 1 capsule (120 mg total) by mouth daily. 07/08/21  Yes Chandrasekhar, Mahesh A, MD  ?Fluticasone-Umeclidin-Vilant (TRELEGY ELLIPTA) 100-62.5-25 MCG/ACT AEPB Inhale 1 puff into the lungs daily. 07/31/21  Yes Olalere, Adewale A, MD  ?HYDROcodone-acetaminophen (NORCO/VICODIN) 5-325 MG tablet Take 2 tablets by mouth every 6 (six) hours as needed for moderate pain. 07/31/21  Yes Olalere, Adewale A, MD  ?nitroGLYCERIN (NITROSTAT) 0.4 MG SL tablet Place 1 tablet (0.4 mg total) under the tongue every 5 (five) minutes as needed for chest pain. 02/25/21  Yes Jettie Booze, MD  ?pantoprazole (PROTONIX) 40 MG tablet Take 1 tablet (40 mg total) by mouth daily. 02/28/21  02/28/22 Yes Jettie Booze, MD  ?acetaminophen (TYLENOL) 500 MG tablet Take 500 mg by mouth every 6 (six) hours as needed.    [provider]  ?clonazePAM (KLONOPIN) 0.5 MG tablet Take 0.5

## 2021-08-06 NOTE — Progress Notes (Signed)
?PROGRESS NOTE ? ? ? ?Alex [name]  TGY:[id number] DOB: Sep 08, 1950 DOA: 08/05/2021 ?PCP: Alroy Dust, L.Marlou Sa, MD  ? ?Brief Narrative: ?71 year old with past medical history significant for CAD status post stent in December 2022 on dual antiplatelet agent, presented with chest pain for some time now and had work-up done by cardiologist including a PET scan which showed pleural effusion and consolidation.  Underwent thoracentesis 2 days prior to admission and he presented with worsening chest pain. ?He report poor oral intake.  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  Patient was admitted for further work-up. ? ? ? ? ? ?Assessment & Plan: ?  ?Principal Problem: ?  Mass in chest ?Active Problems: ?  Coronary artery disease of native artery of native heart with stable angina pectoris ([place]) ?  Mediastinal mass ? ? ?1-Lung mass///possible PNA  ?Left Pleural Effusion. S/P thoracentesis 5/15 ?CT angio chest: Heterogeneously enhancing consolidation in the medial left lower lobe with new small area of cavitation.  Adjacent to an inseparable from consolidation is a 6 x 4 x 9.6 cm low-density mass with thickened and irregular enhancing margins, abutting  and inseparable from the descending thoracic aorta and distal esophagus.  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 ?-Pulmonologist Consulted.  ?-Patient will need bronchoscopy, Plavix discontinue.  ?-Continue with Ceftriaxone and Azithro,  ?-Follow Cytology from pleural fluid.  ?-Will consult Oncology.  ? 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  1.1 cm necrotic nodule posterior to the right kidney. ?-consult oncology  ? ?3-CAD status post stent December 2022: Underwent antiplatelet therapy ?Holding Plavix for bronchoscopy. Discussed with DR Irish Lack, ok to  hold plavix prior to bronchoscopy.  ?Continue with Cardizem and statins ? ?Dysphagia; suspect related to mass lung, pressing esophagus./  ?Undergoing evaluation. Probably mass could be pressing the esophagus.  ?Started Ensure.  ?Might need esophagogram.  ?Constipation; Started  senna, miralax.  ?Hypokalemia; replete orally.  ? ?Estimated body mass index is 16.77 kg/m? as calculated from the following: ?  Height as of this encounter: 5\' 7"  (1.702 m). ?  Weight as of this encounter: 48.6 kg. ? ? ?DVT prophylaxis: SCD ?Code Status: Full code ?Family Communication: Care discussed with wife ?Disposition Plan:  ?Status is: Inpatient ?Remains inpatient appropriate because: admitted with lung mass. Chest pain  ? ? ? ?Consultants:  ?Pulmonologist  ? ?Procedures:  ?CTA ? ?Antimicrobials:  ? ? ?Subjective: ?He report chest pain. He has poor appetite. Also report difficulty swallowing.  ? ?Objective: ?Vitals:  ? 08/06/21 0227 08/06/21 0641 08/06/21 0826 08/06/21 1345  ?BP: 128/78 111/69  107/69  ?Pulse: 96 99  92  ?Resp: 18 16  18   ?Temp: 98.3 ?F (36.8 ?C) 98.4 ?F (36.9 ?C)  98.2 ?F (36.8 ?C)  ?TempSrc: Oral Oral  Oral  ?SpO2: 98% 94% 95% 96%  ?Weight:      ?Height:      ? ? ?Intake/Output Summary (Last 24 hours) at 08/06/2021 1440 ?Last data filed at 08/06/2021 1300 ?Gross per 24 hour  ?Intake 360 ml  ?Output --  ?Net 360 ml  ? ?Filed Weights  ? 08/05/21 0901 08/05/21 1827  ?Weight: 49.8 kg 48.6 kg  ? ? ?Examination: ? ?General exam: Appears calm and comfortable , thin appearing ?Respiratory system: Clear to auscultation. Respiratory effort normal. ?Cardiovascular system: S1 & S2 heard, RRR.  ?Gastrointestinal system: Abdomen  is nondistended, soft and nontender. No organomegaly or masses felt. Normal bowel sounds heard. ?Central nervous system: Alert and oriented.  ?Extremities: Symmetric 5 x 5 power. ? ? ? ?Data Reviewed: I have personally reviewed following labs and imaging studies ? ?CBC: ?Recent Labs  ?Lab 08/05/21 ?0929  08/06/21 ?0402  ?WBC 13.1* 13.3*  ?NEUTROABS 10.3*  --   ?HGB 13.7 13.2  ?HCT 41.3 40.1  ?MCV 88.1 89.1  ?PLT 278 268  ? ?Basic Metabolic Panel: ?Recent Labs  ?Lab 08/05/21 ?0929 08/06/21 ?0402  ?NA 136 136  ?K 3.2* 3.4*  ?CL 92* 96*  ?CO2 31 29  ?GLUCOSE 116* 119*  ?BUN 21 20  ?CREATININE 0.83 0.81  ?CALCIUM 10.0 9.1  ? ?GFR: ?Estimated Creatinine Clearance: 56.7 mL/min (by C-G formula based on SCr of 0.81 mg/dL). ?Liver Function Tests: ?Recent Labs  ?Lab 08/05/21 ?0929 08/06/21 ?0402  ?AST 20 25  ?ALT 19 24  ?ALKPHOS 104 91  ?BILITOT 0.9 1.0  ?PROT 7.7 6.9  ?ALBUMIN 4.1 3.6  ? ?Recent Labs  ?Lab 08/05/21 ?0929  ?LIPASE 18  ? ?No results for input(s): AMMONIA in the last 168 hours. ?Coagulation Profile: ?No results for input(s): INR, PROTIME in the last 168 hours. ?Cardiac Enzymes: ?No results for input(s): CKTOTAL, CKMB, CKMBINDEX, TROPONINI in the last 168 hours. ?BNP (last 3 results) ?No results for input(s): PROBNP in the last 8760 hours. ?HbA1C: ?No results for input(s): HGBA1C in the last 72 hours. ?CBG: ?No results for input(s): GLUCAP in the last 168 hours. ?Lipid Profile: ?No results for input(s): CHOL, HDL, LDLCALC, TRIG, CHOLHDL, LDLDIRECT in the last 72 hours. ?Thyroid Function Tests: ?No results for input(s): TSH, T4TOTAL, FREET4, T3FREE, THYROIDAB in the last 72 hours. ?Anemia Panel: ?No results for input(s): VITAMINB12, FOLATE, FERRITIN, TIBC, IRON, RETICCTPCT in the last 72 hours. ?Sepsis Labs: ?No results for input(s): PROCALCITON, LATICACIDVEN in the last 168 hours. ? ?Recent Results (from the past 240 hour(s))  ?Gram stain     Status: None  ? Collection Time: 08/04/21 10:33 AM  ? Specimen: PATH Cytology Pleural fluid  ?Result Value Ref Range Status  ? Specimen Description PLEURAL FLUID  Final  ? Special Requests NONE  Final  ? Gram Stain   Final  ?  NO WBC SEEN ?NO ORGANISMS SEEN ?Performed at [facility], [place] [address] [place], [place] [address] ?  ? Report Status 08/04/2021 FINAL   Final  ?Acid Fast Smear (AFB)     Status: None  ? Collection Time: 08/04/21 10:33 AM  ? Specimen: PATH Cytology Pleural fluid  ?Result Value Ref Range Status  ? AFB Specimen Processing Concentration  Final  ? Acid Fast Smear Negative  Final  ?  Comment: (NOTE) ?Performed At: [facility] ?[address] [place], [place] [id number] ?Rush Farmer MD [id number] ?  ? Source (AFB) PLEURAL  Final  ?  Comment: FLUID ?Performed at [facility], [place] [address] [place], [place] [address] ?  ?Blood culture (routine x 2)     Status: None (Preliminary result)  ? Collection Time: 08/05/21  9:28 AM  ? Specimen: Right Antecubital; Blood  ?Result Value Ref Range Status  ? Specimen Description   Final  ?  RIGHT ANTECUBITAL ?Performed at [facility], [address], [place], [place] [address] ?  ? Special Requests   Final  ?  BOTTLES DRAWN AEROBIC AND ANAEROBIC Blood Culture results may not be optimal due to an inadequate volume of blood received  in culture bottles ?Performed at [facility], [address], [place], [place] [address] ?  ? Culture   Final  ?  NO GROWTH < 24 HOURS ?Performed at [facility], [place] [address], [place], [place] [address] ?  ? Report Status PENDING  Incomplete  ?Blood culture (routine x 2)     Status: None (Preliminary result)  ? Collection Time: 08/05/21  9:29 AM  ? Specimen: BLOOD LEFT FOREARM  ?Result Value Ref Range Status  ? Specimen Description   Final  ?  BLOOD LEFT FOREARM ?Performed at [facility], [address], [place], [place] [address] ?  ? Special Requests   Final  ?  BOTTLES DRAWN AEROBIC AND ANAEROBIC Blood Culture adequate volume ?Performed at [facility], [address] [place], [place] [address] ?  ? Culture   Final  ?  NO GROWTH < 24 HOURS ?Performed at [facility],  [address], [place], [place] [address] ?  ? Report Status PENDING  Incomplete  ?  ? ? ? ? ? ?Radiology  Studies: ?DG Chest 2 View ? ?Result Date: 08/05/2021 ?CLINICAL DATA:  Recent thoracentesis with 500 cc removed. LEFT-sided pain. EXAM: CHEST - 2 VIEW COMPARISON:  08/04/2021, 07/31/2021 FINDINGS: Normal card

## 2021-08-07 DIAGNOSIS — R222 Localized swelling, mass and lump, trunk: Secondary | ICD-10-CM | POA: Diagnosis not present

## 2021-08-07 LAB — FOLATE: Folate: 8.7 ng/mL (ref >5.9)

## 2021-08-07 LAB — CBC
HCT: 37.2 % — ABNORMAL LOW (ref 39.0–52.0)
Hemoglobin: 12.9 g/dL — ABNORMAL LOW (ref 13.0–17.0)
MCH: 30.6 pg (ref 26.0–34.0)
MCHC: 34.7 g/dL (ref 30.0–36.0)
MCV: 88.4 fL (ref 80.0–100.0)
Platelets: 252 10*3/uL (ref 150–400)
RBC: 4.21 MIL/uL — ABNORMAL LOW (ref 4.22–5.81)
RDW: 12.8 % (ref 11.5–15.5)
WBC: 13.3 10*3/uL — ABNORMAL HIGH (ref 4.0–10.5)
nRBC: 0 % (ref 0.0–0.2)

## 2021-08-07 LAB — RETICULOCYTES
Immature Retic Fract: 4.1 % (ref 2.3–15.9)
RBC.: 4.27 MIL/uL (ref 4.22–5.81)
Retic Count, Absolute: 39.7 10*3/uL (ref 19.0–186.0)
Retic Ct Pct: 0.9 % (ref 0.4–3.1)

## 2021-08-07 LAB — CHOLESTEROL, BODY FLUID: Cholesterol, Fluid: 59 mg/dL

## 2021-08-07 LAB — IRON AND TIBC
Iron: 40 ug/dL — ABNORMAL LOW (ref 45–182)
Saturation Ratios: 15 % — ABNORMAL LOW (ref 17.9–39.5)
TIBC: 261 ug/dL (ref 250–450)
UIBC: 221 ug/dL

## 2021-08-07 LAB — VITAMIN D 25 HYDROXY (VIT D DEFICIENCY, FRACTURES): Vit D, 25-Hydroxy: 16.67 ng/mL — ABNORMAL LOW (ref 30–100)

## 2021-08-07 LAB — BASIC METABOLIC PANEL
Anion gap: 9 (ref 5–15)
BUN: 22 mg/dL (ref 8–23)
CO2: 30 mmol/L (ref 22–32)
Calcium: 9.1 mg/dL (ref 8.9–10.3)
Chloride: 99 mmol/L (ref 98–111)
Creatinine, Ser: 0.68 mg/dL (ref 0.61–1.24)
GFR, Estimated: >60 mL/min (ref >60)
Glucose, Bld: 118 mg/dL — ABNORMAL HIGH (ref 70–99)
Potassium: 3.4 mmol/L — ABNORMAL LOW (ref 3.5–5.1)
Sodium: 138 mmol/L (ref 135–145)

## 2021-08-07 LAB — CYTOLOGY - NON PAP

## 2021-08-07 LAB — VITAMIN B12: Vitamin B-12: 562 pg/mL (ref 180–914)

## 2021-08-07 LAB — FERRITIN: Ferritin: 243 ng/mL (ref 24–336)

## 2021-08-07 MED ORDER — POTASSIUM CHLORIDE 20 MEQ PO PACK
40.0000 meq | PACK | Freq: Two times a day (BID) | ORAL | Status: AC
  Administered 2021-08-07: 40 meq via ORAL
  Filled 2021-08-07: qty 2

## 2021-08-07 MED ORDER — ASPIRIN 81 MG PO TBEC
81.0000 mg | DELAYED_RELEASE_TABLET | Freq: Every day | ORAL | Status: DC
  Administered 2021-08-08: 81 mg via ORAL
  Filled 2021-08-07: qty 1

## 2021-08-07 MED ORDER — BISACODYL 10 MG RE SUPP
10.0000 mg | Freq: Once | RECTAL | Status: AC
  Administered 2021-08-07: 10 mg via RECTAL
  Filled 2021-08-07: qty 1

## 2021-08-07 MED ORDER — VITAMIN D (ERGOCALCIFEROL) 1.25 MG (50000 UNIT) PO CAPS
50000.0000 [IU] | ORAL_CAPSULE | ORAL | Status: DC
  Administered 2021-08-07 – 2021-08-14 (×2): 50000 [IU] via ORAL
  Filled 2021-08-07 (×2): qty 1

## 2021-08-07 MED ORDER — FLEET ENEMA 7-19 GM/118ML RE ENEM: 1.0000 | ENEMA | Freq: Once | RECTAL | Status: DC

## 2021-08-07 NOTE — Progress Notes (Signed)
PROGRESS NOTE    Alex [name]  VFI:[id number] DOB: [birth year] DOA: 08/05/2021 PCP: Alroy Dust, L.Marlou Sa, MD   Brief Narrative: 71 year old with past medical history significant for CAD status post stent in December 2022 on dual antiplatelet agent, presented with chest pain for some time now and had work-up done by cardiologist including a PET scan which showed pleural effusion and consolidation.  Underwent thoracentesis 2 days prior to admission and he presented with worsening chest pain. He report poor oral intake.  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  Patient was admitted for further work-up.      Assessment & Plan:   Principal Problem:   Mass in chest Active Problems:   Coronary artery disease of native artery of native heart with stable angina pectoris (HCC)   Mediastinal mass   Protein-calorie malnutrition, severe   1-Lung mass/// superimpose PNA  Left Pleural Effusion. S/P thoracentesis 5/15 -CT angio chest: Heterogeneously enhancing consolidation in the medial left lower lobe with new small area of cavitation.  Adjacent to an inseparable from consolidation is a 6 x 4 x 9.6 cm low-density mass with thickened and irregular enhancing margins, abutting  and inseparable from the descending thoracic aorta and distal esophagus.  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 -Pulmonologist Consulted. Patient might need Bronchoscopy if cytologu from pleural fluid is non diagnostic.  -Patient will need bronchoscopy, Plavix discontinue.  -Continue with Ceftriaxone and Azithro  -Follow Cytology from pleural fluid. Still pending.  -Discussed with Oncology, we need to contact them when Diagnosis is established,.   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  1.1 cm necrotic  nodule posterior to the right kidney. -He will need oncology consultation.   3-CAD status post stent December 2022: Underwent antiplatelet therapy Holding Plavix for bronchoscopy. Discussed with Dr. Irish Lack, ok to hold plavix prior to bronchoscopy.  Continue with Cardizem and statins  Dysphagia; suspect related to lung mass pressing esophagus./  Undergoing evaluation. Probably mass could be pressing the esophagus.  Started Ensure.   Constipation; Started  senna, miralax. Dulcolax suppository.  Hypokalemia; replete orally.   Estimated body mass index is 16.77 kg/m as calculated from the following:   Height as of this encounter: 5\' 7"  (1.702 m).   Weight as of this encounter: 48.6 kg.   DVT prophylaxis: SCD Code Status: Full code Family Communication: Care discussed with wife 5/17 Disposition Plan:  Status is: Inpatient Remains inpatient appropriate because: admitted with lung mass. Chest pain     Consultants:  Pulmonologist   Procedures:  CTA  Antimicrobials:    Subjective: He feels better. He has not had a BM.   Objective: Vitals:   08/06/21 2024 08/07/21 0551 08/07/21 0700 08/07/21 0945  BP: 121/73 118/72  (!) 144/77  Pulse: 81 80 84 80  Resp: 16 18  18   Temp: 97.7 F (36.5 C) 98.1 F (36.7 C)  98 F (36.7 C)  TempSrc: Oral Oral    SpO2: 96% 94% 94% 97%  Weight:      Height:        Intake/Output Summary (Last 24 hours) at 08/07/2021 1014 Last data filed at 08/06/2021 1847 Gross per 24 hour  Intake 383.99 ml  Output --  Net 383.99 ml    Filed Weights   08/05/21 0901 08/05/21 1827  Weight: 49.8 kg 48.6 kg  Examination:  General exam: NAD Respiratory system: CTA Cardiovascular system: S 1, S 2 RRR Gastrointestinal system: BS present, soft, nt Central nervous system: alert, and conversant.  Extremities: No edema    Data Reviewed: I have personally reviewed following labs and imaging studies  CBC: Recent Labs  Lab 08/05/21 0929  08/06/21 0402 08/07/21 0438  WBC 13.1* 13.3* 13.3*  NEUTROABS 10.3*  --   --   HGB 13.7 13.2 12.9*  HCT 41.3 40.1 37.2*  MCV 88.1 89.1 88.4  PLT 278 268 809    Basic Metabolic Panel: Recent Labs  Lab 08/05/21 0929 08/06/21 0402 08/07/21 0438  NA 136 136 138  K 3.2* 3.4* 3.4*  CL 92* 96* 99  CO2 31 29 30   GLUCOSE 116* 119* 118*  BUN 21 20 22   CREATININE 0.83 0.81 0.68  CALCIUM 10.0 9.1 9.1    GFR: Estimated Creatinine Clearance: 57.4 mL/min (by C-G formula based on SCr of 0.68 mg/dL). Liver Function Tests: Recent Labs  Lab 08/05/21 0929 08/06/21 0402  AST 20 25  ALT 19 24  ALKPHOS 104 91  BILITOT 0.9 1.0  PROT 7.7 6.9  ALBUMIN 4.1 3.6    Recent Labs  Lab 08/05/21 0929  LIPASE 18    No results for input(s): AMMONIA in the last 168 hours. Coagulation Profile: No results for input(s): INR, PROTIME in the last 168 hours. Cardiac Enzymes: No results for input(s): CKTOTAL, CKMB, CKMBINDEX, TROPONINI in the last 168 hours. BNP (last 3 results) No results for input(s): PROBNP in the last 8760 hours. HbA1C: No results for input(s): HGBA1C in the last 72 hours. CBG: No results for input(s): GLUCAP in the last 168 hours. Lipid Profile: No results for input(s): CHOL, HDL, LDLCALC, TRIG, CHOLHDL, LDLDIRECT in the last 72 hours. Thyroid Function Tests: No results for input(s): TSH, T4TOTAL, FREET4, T3FREE, THYROIDAB in the last 72 hours. Anemia Panel: Recent Labs    08/07/21 0438  VITAMINB12 562  FOLATE 8.7  FERRITIN 243  TIBC 261  IRON 40*  RETICCTPCT 0.9   Sepsis Labs: No results for input(s): PROCALCITON, LATICACIDVEN in the last 168 hours.  Recent Results (from the past 240 hour(s))  Gram stain     Status: None   Collection Time: 08/04/21 10:33 AM   Specimen: PATH Cytology Pleural fluid  Result Value Ref Range Status   Specimen Description PLEURAL FLUID  Final   Special Requests NONE  Final   Gram Stain   Final    NO WBC SEEN NO ORGANISMS  SEEN Performed at [facility], [address] [place], [place] [address]    Report Status 08/04/2021 FINAL  Final  Acid Fast Smear (AFB)     Status: None   Collection Time: 08/04/21 10:33 AM   Specimen: PATH Cytology Pleural fluid  Result Value Ref Range Status   AFB Specimen Processing Concentration  Final   Acid Fast Smear Negative  Final    Comment: (NOTE) Performed At: [facility] [place], [place] [id number] Rush Farmer MD HA:[id number]    Source (AFB) PLEURAL  Final    Comment: FLUID Performed at [facility], [place] [address], [place], [place] [address]   Blood culture (routine x 2)     Status: None (Preliminary result)   Collection Time: 08/05/21  9:28 AM   Specimen: Right Antecubital; Blood  Result Value Ref Range Status   Specimen Description   Final    RIGHT ANTECUBITAL Performed at [facility], [address], [place], [place] [address]    Special Requests   Final    BOTTLES DRAWN AEROBIC AND ANAEROBIC Blood Culture results may not be optimal due to an inadequate volume of blood received in culture bottles Performed at [facility], [address], [place], [place] [address]    Culture   Final    NO GROWTH 2 DAYS Performed at [facility], [place] [address] [place], [place] [address]    Report Status PENDING  Incomplete  Blood culture (routine x 2)     Status: None (Preliminary result)   Collection Time: 08/05/21  9:29 AM   Specimen: BLOOD LEFT FOREARM  Result Value Ref Range Status   Specimen Description   Final    BLOOD LEFT FOREARM Performed at [facility], [address] [place], [place] [address]    Special Requests   Final    BOTTLES DRAWN AEROBIC AND ANAEROBIC Blood Culture adequate volume Performed at [facility], [address] [place], [place] [address]    Culture   Final    NO GROWTH 2 DAYS Performed at [facility], [place] [address], [place], [place] [address]    Report Status PENDING  Incomplete          Radiology Studies: CT Angio Chest PE W and/or Wo Contrast  Result Date: 08/05/2021 CLINICAL DATA:  Chest pain.  Left thoracentesis yesterday. EXAM: CT ANGIOGRAPHY CHEST CT ABDOMEN AND PELVIS WITH CONTRAST TECHNIQUE: Multidetector CT imaging of the chest was performed using the standard protocol during bolus administration of intravenous contrast. Multiplanar CT image reconstructions and MIPs were obtained to evaluate the vascular anatomy. Multidetector CT imaging of the abdomen and pelvis was performed using the standard protocol during bolus administration of intravenous contrast. RADIATION DOSE REDUCTION: This exam was performed according to the departmental dose-optimization program which includes automated exposure control, adjustment of the mA and/or kV according to patient size and/or use of iterative reconstruction technique. CONTRAST:  45mL OMNIPAQUE IOHEXOL 350 MG/ML SOLN COMPARISON:  Chest x-ray from same day. Cardiac PET-CT dated Jul 22, 2021. FINDINGS: CTA CHEST FINDINGS Cardiovascular: Satisfactory opacification of the pulmonary arteries to the segmental level. No evidence of pulmonary embolism. Normal heart size. No pericardial effusion. No thoracic aortic aneurysm or dissection. Coronary, aortic arch, and branch vessel atherosclerotic vascular disease. Mediastinum/Nodes: Mildly enlarged left hilar and ill-defined subcarinal lymph nodes measuring up to 1.4 cm in short axis. No enlarged axillary lymph nodes. The thyroid gland and esophagus demonstrate no significant findings. Lungs/Pleura: Trace secretions in the esophagus. Continued consolidation in the medial left lower lobe with heterogeneous enhancement and new small areas of cavitation. Adjacent to and inseparable from the medial left lower lobe consolidation is a 6.0 x 4.3 x 9.6 cm area low-density mass with thickened, irregular enhancing  margins, abutting and inseparable from the descending thoracic aorta. This abnormal soft tissue contacts the distal esophagus as well and extends through the diaphragmatic hiatus to abut the celiac axis and left renal vein. Decreased small left pleural effusion with thin pleural enhancement. No pneumothorax. Bilateral calcified pleural plaques and moderate centrilobular emphysema again noted. 6 mm subpleural nodular density in the posterior right lower lobe was not present on the CT from 2 weeks ago and likely represents atelectasis. No follow-up imaging is recommended. Musculoskeletal: Pectus excavatum deformity again noted. No acute or significant osseous findings. Review of the MIP images confirms the above findings. CT ABDOMEN AND PELVIS FINDINGS  Hepatobiliary: No focal liver abnormality is seen. No gallstones, gallbladder wall thickening, or biliary dilatation. Pancreas: Unremarkable. No pancreatic ductal dilatation or surrounding inflammatory changes. Spleen: Normal in size without focal abnormality. Adrenals/Urinary Tract: Adrenal glands are unremarkable. Kidneys are normal, without renal calculi, focal lesion, or hydronephrosis. Bladder is unremarkable. Stomach/Bowel: Stomach is within normal limits. Appendix appears normal. No evidence of bowel wall thickening, distention, or inflammatory changes. Moderate amount of stool in the colon. Vascular/Lymphatic: Aortic atherosclerosis. Abnormal soft tissue density extending from the thorax through the diaphragmatic hiatus to abut the celiac axis and left renal vein (series 2, images 14-21; series 5, images 34-42)). 1.3 cm aortocaval necrotic lymph node versus tumor extension (series 2, image 23). Reproductive: Borderline enlarged prostate gland. Other: No abdominal wall hernia or abnormality. No abdominopelvic ascites. No pneumoperitoneum. 1.1 cm necrotic nodule posterior to the right kidney (series 2, image 36). Musculoskeletal: No acute or significant osseous  findings. 1.7 cm well-defined lucent lesion with thin sclerotic rim in the L5 vertebral body, likely benign cyst. Review of the MIP images confirms the above findings. IMPRESSION: 1. No evidence of pulmonary embolism. 2. Continued heterogeneously enhancing consolidation in the medial left lower lobe with new small areas of cavitation. Adjacent to and inseparable from the consolidation is a 6.0 x 4.3 x 9.6 cm low-density mass with thickened, irregular enhancing margins, abutting and inseparable from the descending thoracic aorta and distal esophagus. Constellation of findings is concerning for primary bronchogenic carcinoma with mediastinal invasion. There may be superimposed left lower lobe pneumonia as well. 3. Necrotic tumor also extends into the upper abdomen through the diaphragmatic hiatus and abuts the celiac axis and left renal vein. 1.3 cm aortocaval necrotic lymph node versus tumor extension. 1.1 cm necrotic nodule posterior to the right kidney. 4. Mildly enlarged left hilar and ill-defined subcarinal lymphadenopathy, concerning for nodal metastatic disease. 5. Decreased small left pleural effusion with thin pleural enhancement, concerning for malignant effusion. 6. Aortic Atherosclerosis (ICD10-I70.0) and Emphysema (ICD10-J43.9). Electronically Signed   By: Titus Dubin M.D.   On: 08/05/2021 11:30   CT ABDOMEN PELVIS W CONTRAST  Result Date: 08/05/2021 CLINICAL DATA:  Chest pain.  Left thoracentesis yesterday. EXAM: CT ANGIOGRAPHY CHEST CT ABDOMEN AND PELVIS WITH CONTRAST TECHNIQUE: Multidetector CT imaging of the chest was performed using the standard protocol during bolus administration of intravenous contrast. Multiplanar CT image reconstructions and MIPs were obtained to evaluate the vascular anatomy. Multidetector CT imaging of the abdomen and pelvis was performed using the standard protocol during bolus administration of intravenous contrast. RADIATION DOSE REDUCTION: This exam was performed  according to the departmental dose-optimization program which includes automated exposure control, adjustment of the mA and/or kV according to patient size and/or use of iterative reconstruction technique. CONTRAST:  44mL OMNIPAQUE IOHEXOL 350 MG/ML SOLN COMPARISON:  Chest x-ray from same day. Cardiac PET-CT dated Jul 22, 2021. FINDINGS: CTA CHEST FINDINGS Cardiovascular: Satisfactory opacification of the pulmonary arteries to the segmental level. No evidence of pulmonary embolism. Normal heart size. No pericardial effusion. No thoracic aortic aneurysm or dissection. Coronary, aortic arch, and branch vessel atherosclerotic vascular disease. Mediastinum/Nodes: Mildly enlarged left hilar and ill-defined subcarinal lymph nodes measuring up to 1.4 cm in short axis. No enlarged axillary lymph nodes. The thyroid gland and esophagus demonstrate no significant findings. Lungs/Pleura: Trace secretions in the esophagus. Continued consolidation in the medial left lower lobe with heterogeneous enhancement and new small areas of cavitation. Adjacent to and inseparable from the medial left lower lobe consolidation is  a 6.0 x 4.3 x 9.6 cm area low-density mass with thickened, irregular enhancing margins, abutting and inseparable from the descending thoracic aorta. This abnormal soft tissue contacts the distal esophagus as well and extends through the diaphragmatic hiatus to abut the celiac axis and left renal vein. Decreased small left pleural effusion with thin pleural enhancement. No pneumothorax. Bilateral calcified pleural plaques and moderate centrilobular emphysema again noted. 6 mm subpleural nodular density in the posterior right lower lobe was not present on the CT from 2 weeks ago and likely represents atelectasis. No follow-up imaging is recommended. Musculoskeletal: Pectus excavatum deformity again noted. No acute or significant osseous findings. Review of the MIP images confirms the above findings. CT ABDOMEN AND  PELVIS FINDINGS Hepatobiliary: No focal liver abnormality is seen. No gallstones, gallbladder wall thickening, or biliary dilatation. Pancreas: Unremarkable. No pancreatic ductal dilatation or surrounding inflammatory changes. Spleen: Normal in size without focal abnormality. Adrenals/Urinary Tract: Adrenal glands are unremarkable. Kidneys are normal, without renal calculi, focal lesion, or hydronephrosis. Bladder is unremarkable. Stomach/Bowel: Stomach is within normal limits. Appendix appears normal. No evidence of bowel wall thickening, distention, or inflammatory changes. Moderate amount of stool in the colon. Vascular/Lymphatic: Aortic atherosclerosis. Abnormal soft tissue density extending from the thorax through the diaphragmatic hiatus to abut the celiac axis and left renal vein (series 2, images 14-21; series 5, images 34-42)). 1.3 cm aortocaval necrotic lymph node versus tumor extension (series 2, image 23). Reproductive: Borderline enlarged prostate gland. Other: No abdominal wall hernia or abnormality. No abdominopelvic ascites. No pneumoperitoneum. 1.1 cm necrotic nodule posterior to the right kidney (series 2, image 36). Musculoskeletal: No acute or significant osseous findings. 1.7 cm well-defined lucent lesion with thin sclerotic rim in the L5 vertebral body, likely benign cyst. Review of the MIP images confirms the above findings. IMPRESSION: 1. No evidence of pulmonary embolism. 2. Continued heterogeneously enhancing consolidation in the medial left lower lobe with new small areas of cavitation. Adjacent to and inseparable from the consolidation is a 6.0 x 4.3 x 9.6 cm low-density mass with thickened, irregular enhancing margins, abutting and inseparable from the descending thoracic aorta and distal esophagus. Constellation of findings is concerning for primary bronchogenic carcinoma with mediastinal invasion. There may be superimposed left lower lobe pneumonia as well. 3. Necrotic tumor also  extends into the upper abdomen through the diaphragmatic hiatus and abuts the celiac axis and left renal vein. 1.3 cm aortocaval necrotic lymph node versus tumor extension. 1.1 cm necrotic nodule posterior to the right kidney. 4. Mildly enlarged left hilar and ill-defined subcarinal lymphadenopathy, concerning for nodal metastatic disease. 5. Decreased small left pleural effusion with thin pleural enhancement, concerning for malignant effusion. 6. Aortic Atherosclerosis (ICD10-I70.0) and Emphysema (ICD10-J43.9). Electronically Signed   By: Titus Dubin M.D.   On: 08/05/2021 11:30        Scheduled Meds:  aspirin EC  81 mg Oral Daily   atorvastatin  80 mg Oral Daily   bisacodyl  10 mg Rectal Once   diltiazem  120 mg Oral Daily   feeding supplement  1 Container Oral TID BM   feeding supplement  237 mL Oral BID BM   fluticasone furoate-vilanterol  1 puff Inhalation Daily   And   umeclidinium bromide  1 puff Inhalation Daily   multivitamin with minerals  1 tablet Oral Daily   pantoprazole  40 mg Oral Daily   polyethylene glycol  17 g Oral BID   potassium chloride  40 mEq Oral BID  senna-docusate  1 tablet Oral BID   Vitamin D (Ergocalciferol)  50,000 Units Oral Q7 days   Continuous Infusions:  azithromycin 500 mg (08/06/21 1252)   cefTRIAXone (ROCEPHIN)  IV 2 g (08/06/21 1216)     LOS: 2 days    Time spent: 35 minutes.     Elmarie Shiley, MD [facility]   If 7PM-7AM, please contact night-coverage [email or web address]  08/07/2021, 10:14 AM

## 2021-08-07 NOTE — Consult Note (Signed)
NAME:  Alex [name], MRN:  [id number], DOB:  1950/08/24, LOS: 2 ADMISSION DATE:  08/05/2021, CONSULTATION DATE:  08/06/2021 REFERRING MD:  Niel Hummer, CHIEF COMPLAINT:  Lung pass with pleural effusion    History of Present Illness:  71 yo male former smoker had PCI with DAPT in December 2022.  He had persistent weight loss, dysphagia, and chest pain.  He had cardiac PET scan on 07/23/21.  This showed Lt lower lobe airspace disease and Lt pleural effusion, pleural plaques, emphysema, and 4 mm nodule in Rt lung.  He was treated with doxycycline.  He was seen by Dr. Ander Slade in pulmonary office.  He had left thoracentesis done with IR on 08/04/21 with 550 ml fluid removed.  Fluid analysis consistent with an exudate (LDH 511, protein 5.1, glucose 108, WBC 2340 with 82% lymphocytes, gram stain negative).  He had progressive chest pain and dyspnea after procedure and presented to the ER and was admitted to hospital.  CT chest/abd/pelvis from 08/05/21 showed: "Consolidation of medial LLL with area of cavitation.  Adjacent to and inseparable from the medial left lower lobe consolidation is a 6.0 x 4.3 x 9.6 cm area low-density mass with thickened, irregular enhancing margins, abutting and inseparable from the descending thoracic aorta. This abnormal soft tissue contacts the distal esophagus as well and extends through the diaphragmatic hiatus to abut the celiac axis and left renal vein.  1.1 cm necrotic nodule posterior Rt kidney."   Pertinent  Medical History   CAD S/P PCI with DAPT started 02/2021, GERD, HLD, anxiety, and arthritis, Pectus excavatum  Significant Hospital Events: Including procedures, antibiotic start and stop dates in addition to other pertinent events   5/16 Admitted with chest pain CTA concerning for new lung mass  5/17 PCCM consulted, plavix held  Interim History / Subjective:  Has discomfort in mid chest retrosternal area and left lower chest.  Objective   Blood pressure  120/78, pulse 78, temperature 98 F (36.7 C), temperature source Oral, resp. rate (!) 24, height 5\' 7"  (1.702 m), weight 48.6 kg, SpO2 93 %.        Intake/Output Summary (Last 24 hours) at 08/07/2021 1435 Last data filed at 08/06/2021 1847 Gross per 24 hour  Intake 383.99 ml  Output --  Net 383.99 ml   Filed Weights   08/05/21 0901 08/05/21 1827  Weight: 49.8 kg 48.6 kg    Examination:  General - alert, thin Eyes - pupils reactive ENT - no sinus tenderness, no stridor Cardiac - regular rate/rhythm, no murmur Chest - decreased BS Lt base Abdomen - soft, non tender, + bowel sounds Extremities - no cyanosis, clubbing, or edema Skin - no rashes Neuro - normal strength, moves extremities, follows commands Psych - normal mood and behavior  Assessment & Plan:   Lt lower lung mass/consolidation with area of cavitation and pleural plaques. Rt lung nodule. - will need to arrange for bronchoscopy with endobronchial ultrasound - he will need to be off plavix for 5 days prior to procedure - continue antibiotics for now  Lt exudate pleural effusion. - pleural fluid cytology from 08/04/21 non diagnostic  Centrilobular emphysema. - continue incruse, breo  Coronary artery disease s/p stent in December 2022, HLD. - plavix on hold - okay to continue ASA for now  Severe malnutrition with odynophagia. 1.1 cm necrotic nodule posterior Rt kidney. - per primary team  Updated his wife by phone on 08/07/21.  Labs :      Latest Ref Rng &  Units 08/07/2021    4:38 AM 08/06/2021    4:02 AM 08/05/2021    9:29 AM  CMP  Glucose 70 - 99 mg/dL 118   119   116    BUN 8 - 23 mg/dL 22   20   21     Creatinine 0.61 - 1.24 mg/dL 0.68   0.81   0.83    Sodium 135 - 145 mmol/L 138   136   136    Potassium 3.5 - 5.1 mmol/L 3.4   3.4   3.2    Chloride 98 - 111 mmol/L 99   96   92    CO2 22 - 32 mmol/L 30   29   31     Calcium 8.9 - 10.3 mg/dL 9.1   9.1   10.0    Total Protein 6.5 - 8.1 g/dL  6.9    7.7    Total Bilirubin 0.3 - 1.2 mg/dL  1.0   0.9    Alkaline Phos 38 - 126 U/L  91   104    AST 15 - 41 U/L  25   20    ALT 0 - 44 U/L  24   19         Latest Ref Rng & Units 08/07/2021    4:38 AM 08/06/2021    4:02 AM 08/05/2021    9:29 AM  CBC  WBC 4.0 - 10.5 K/uL 13.3   13.3   13.1    Hemoglobin 13.0 - 17.0 g/dL 12.9   13.2   13.7    Hematocrit 39.0 - 52.0 % 37.2   40.1   41.3    Platelets 150 - 400 K/uL 252   268   278      Signature:  Chesley Mires, MD [place] Pager - [phone] 08/07/2021, 3:03 PM

## 2021-08-08 ENCOUNTER — Inpatient Hospital Stay (HOSPITAL_COMMUNITY): Payer: Medicare Other

## 2021-08-08 DIAGNOSIS — R222 Localized swelling, mass and lump, trunk: Secondary | ICD-10-CM | POA: Diagnosis not present

## 2021-08-08 DIAGNOSIS — B370 Candidal stomatitis: Secondary | ICD-10-CM

## 2021-08-08 LAB — CBC
HCT: 40.7 % (ref 39.0–52.0)
HCT: 40.9 % (ref 39.0–52.0)
Hemoglobin: 13.4 g/dL (ref 13.0–17.0)
Hemoglobin: 13.8 g/dL (ref 13.0–17.0)
MCH: 29.6 pg (ref 26.0–34.0)
MCH: 29.9 pg (ref 26.0–34.0)
MCHC: 32.9 g/dL (ref 30.0–36.0)
MCHC: 33.7 g/dL (ref 30.0–36.0)
MCV: 90 fL (ref 80.0–100.0)
MCV: 88.7 fL (ref 80.0–100.0)
Platelets: 281 10*3/uL (ref 150–400)
Platelets: 273 10*3/uL (ref 150–400)
RBC: 4.52 MIL/uL (ref 4.22–5.81)
RBC: 4.61 MIL/uL (ref 4.22–5.81)
RDW: 12.5 % (ref 11.5–15.5)
RDW: 12.7 % (ref 11.5–15.5)
WBC: 14.6 10*3/uL — ABNORMAL HIGH (ref 4.0–10.5)
WBC: 15.5 10*3/uL — ABNORMAL HIGH (ref 4.0–10.5)
nRBC: 0 % (ref 0.0–0.2)
nRBC: 0 % (ref 0.0–0.2)

## 2021-08-08 LAB — BASIC METABOLIC PANEL
Anion gap: 10 (ref 5–15)
Anion gap: 12 (ref 5–15)
BUN: 22 mg/dL (ref 8–23)
BUN: 23 mg/dL (ref 8–23)
CO2: 28 mmol/L (ref 22–32)
CO2: 29 mmol/L (ref 22–32)
Calcium: 9 mg/dL (ref 8.9–10.3)
Calcium: 9.5 mg/dL (ref 8.9–10.3)
Chloride: 97 mmol/L — ABNORMAL LOW (ref 98–111)
Chloride: 98 mmol/L (ref 98–111)
Creatinine, Ser: 0.95 mg/dL (ref 0.61–1.24)
Creatinine, Ser: 0.86 mg/dL (ref 0.61–1.24)
GFR, Estimated: >60 mL/min (ref >60)
GFR, Estimated: >60 mL/min (ref >60)
Glucose, Bld: 127 mg/dL — ABNORMAL HIGH (ref 70–99)
Glucose, Bld: 124 mg/dL — ABNORMAL HIGH (ref 70–99)
Potassium: 3.4 mmol/L — ABNORMAL LOW (ref 3.5–5.1)
Potassium: 3.4 mmol/L — ABNORMAL LOW (ref 3.5–5.1)
Sodium: 135 mmol/L (ref 135–145)
Sodium: 139 mmol/L (ref 135–145)

## 2021-08-08 MED ORDER — POTASSIUM CHLORIDE CRYS ER 20 MEQ PO TBCR
40.0000 meq | EXTENDED_RELEASE_TABLET | Freq: Two times a day (BID) | ORAL | Status: AC
  Administered 2021-08-08: 40 meq via ORAL
  Filled 2021-08-08 (×2): qty 2

## 2021-08-08 MED ORDER — ORAL CARE MOUTH RINSE
15.0000 mL | Freq: Two times a day (BID) | OROMUCOSAL | Status: DC
  Administered 2021-08-08 – 2021-08-15 (×13): 15 mL via OROMUCOSAL

## 2021-08-08 MED ORDER — FLUCONAZOLE 100MG IVPB
100.0000 mg | INTRAVENOUS | Status: DC
  Administered 2021-08-08 – 2021-08-10 (×3): 100 mg via INTRAVENOUS
  Filled 2021-08-08 (×7): qty 50

## 2021-08-08 MED ORDER — NYSTATIN 100000 UNIT/ML MT SUSP
5.0000 mL | Freq: Four times a day (QID) | OROMUCOSAL | Status: DC
  Administered 2021-08-08 – 2021-08-15 (×23): 500000 [IU] via ORAL
  Filled 2021-08-08 (×24): qty 5

## 2021-08-08 MED ORDER — OXYCODONE HCL 5 MG PO TABS
5.0000 mg | ORAL_TABLET | ORAL | Status: DC | PRN
  Administered 2021-08-08 – 2021-08-14 (×27): 5 mg via ORAL
  Filled 2021-08-08 (×27): qty 1

## 2021-08-08 NOTE — Consult Note (Signed)
[facility] Consultation Note  Referring Provider: [facility] Primary Care Physician:  Alroy Dust, L.Marlou Sa, MD  Reason for Consultation:  dysphagia  HPI: Alex [name] is a 71 y.o. male several week history of chest wall pain, shortness of breath, weight loss.  Dysphagia S>L over same time frame.  Prior work-up showed pleural effusion, and mediastinal/chest mass with distal encasement of esophagus.  History CAD and PVD, on clopdigirel, last dose yesterday (?).     Past Medical History:  Diagnosis Date   Allergic rhinitis    Anxiety    Aortic atherosclerosis (HCC)    Arthritis    Carotid artery occlusion    Cough    GERD (gastroesophageal reflux disease)    Hip pain    Hyperlipidemia     Past Surgical History:  Procedure Laterality Date   CAROTID ENDARTERECTOMY  July 02, 2009   Right cea   CORONARY STENT INTERVENTION N/A 02/26/2021   Procedure: CORONARY STENT INTERVENTION;  Surgeon: Jettie Booze, MD;  Location: [facility];  Service: Cardiovascular;  Laterality: N/A;  DIAG   EYE SURGERY  May  2011   Bilateral cataract   HERNIA REPAIR     IR THORACENTESIS ASP PLEURAL SPACE W/IMG GUIDE  08/04/2021   LEFT HEART CATH AND CORONARY ANGIOGRAPHY N/A 02/26/2021   Procedure: LEFT HEART CATH AND CORONARY ANGIOGRAPHY;  Surgeon: Jettie Booze, MD;  Location: [facility];  Service: Cardiovascular;  Laterality: N/A;    Prior to Admission medications   Medication Sig Start Date End Date Taking? Authorizing Provider  albuterol (VENTOLIN HFA) 108 (90 Base) MCG/ACT inhaler Inhale into the lungs every 6 (six) hours as needed for wheezing or shortness of breath.   Yes [provider]  aspirin 81 MG EC tablet Take 1 tablet (81 mg total) by mouth daily. 02/26/21 02/26/22 Yes Jettie Booze, MD  atorvastatin (LIPITOR) 80 MG tablet Take 1 tablet (80 mg total) by mouth daily. 02/28/21 02/28/22 Yes Jettie Booze, MD  clopidogrel (PLAVIX)  75 MG tablet Take 1 tablet (75 mg total) by mouth daily with breakfast. 02/28/21  Yes Jettie Booze, MD  diltiazem (CARDIZEM CD) 120 MG 24 hr capsule Take 1 capsule (120 mg total) by mouth daily. 07/08/21  Yes Chandrasekhar, Mahesh A, MD  Fluticasone-Umeclidin-Vilant (TRELEGY ELLIPTA) 100-62.5-25 MCG/ACT AEPB Inhale 1 puff into the lungs daily. 07/31/21  Yes Olalere, Adewale A, MD  HYDROcodone-acetaminophen (NORCO/VICODIN) 5-325 MG tablet Take 2 tablets by mouth every 6 (six) hours as needed for moderate pain. 07/31/21  Yes Olalere, Adewale A, MD  nitroGLYCERIN (NITROSTAT) 0.4 MG SL tablet Place 1 tablet (0.4 mg total) under the tongue every 5 (five) minutes as needed for chest pain. 02/25/21  Yes Jettie Booze, MD  pantoprazole (PROTONIX) 40 MG tablet Take 1 tablet (40 mg total) by mouth daily. 02/28/21 02/28/22 Yes Jettie Booze, MD  acetaminophen (TYLENOL) 500 MG tablet Take 500 mg by mouth every 6 (six) hours as needed.    [provider]  clonazePAM (KLONOPIN) 0.5 MG tablet Take 0.5 mg by mouth 2 (two) times daily as needed for anxiety.    [provider]  ibuprofen (ADVIL) 200 MG tablet Take 200 mg by mouth every 6 (six) hours as needed.    [provider]    Current Facility-Administered Medications  Medication Dose Route Frequency Provider Last Rate Last Admin   albuterol (PROVENTIL) (2.5 MG/3ML) 0.083% nebulizer solution 2.5 mg  2.5 mg Nebulization Q4H  PRN Rise Patience, MD       aspirin EC tablet 81 mg  81 mg Oral Daily Regalado, Belkys A, MD   81 mg at 08/08/21 0945   atorvastatin (LIPITOR) tablet 80 mg  80 mg Oral Daily Rise Patience, MD   80 mg at 08/08/21 0942   azithromycin (ZITHROMAX) 500 mg in sodium chloride 0.9 % 250 mL IVPB  500 mg Intravenous Q24H Rise Patience, MD 250 mL/hr at 08/07/21 1440 500 mg at 08/07/21 1440   cefTRIAXone (ROCEPHIN) 2 g in sodium chloride 0.9 % 100 mL IVPB  2 g Intravenous Q24H Rise Patience, MD 200 mL/hr at 08/08/21 1226 2 g at 08/08/21 1226   clonazePAM (KLONOPIN) tablet 0.5 mg  0.5 mg Oral BID PRN Rise Patience, MD       diltiazem (CARDIZEM CD) 24 hr capsule 120 mg  120 mg Oral Daily Rise Patience, MD   120 mg at 08/08/21 0943   feeding supplement (BOOST / RESOURCE BREEZE) liquid 1 Container  1 Container Oral TID BM Regalado, Belkys A, MD   1 Container at 08/07/21 0947   feeding supplement (ENSURE ENLIVE / ENSURE PLUS) liquid 237 mL  237 mL Oral BID BM Regalado, Belkys A, MD       fluconazole (DIFLUCAN) IVPB 100 mg  100 mg Intravenous Q24H Regalado, Belkys A, MD       fluticasone furoate-vilanterol (BREO ELLIPTA) 100-25 MCG/ACT 1 puff  1 puff Inhalation Daily Rise Patience, MD   1 puff at 08/06/21 0825   And   umeclidinium bromide (INCRUSE ELLIPTA) 62.5 MCG/ACT 1 puff  1 puff Inhalation Daily Rise Patience, MD   1 puff at 08/06/21 0825   MEDLINE mouth rinse  15 mL Mouth Rinse BID Regalado, Belkys A, MD   15 mL at 08/08/21 0948   morphine (PF) 2 MG/ML injection 1 mg  1 mg Intravenous Q4H PRN Rise Patience, MD   1 mg at 08/08/21 0943   multivitamin with minerals tablet 1 tablet  1 tablet Oral Daily Regalado, Belkys A, MD   1 tablet at 08/08/21 0942   nitroGLYCERIN (NITROSTAT) SL tablet 0.4 mg  0.4 mg Sublingual Q5 min PRN Rise Patience, MD       nystatin (MYCOSTATIN) 100000 UNIT/ML suspension 500,000 Units  5 mL Oral QID Regalado, Belkys A, MD       ondansetron (ZOFRAN) injection 4 mg  4 mg Intravenous Q6H PRN Regalado, Belkys A, MD   4 mg at 08/08/21 0943   oxyCODONE (Oxy IR/ROXICODONE) immediate release tablet 5 mg  5 mg Oral Q4H PRN Regalado, Belkys A, MD   5 mg at 08/08/21 1229   pantoprazole (PROTONIX) EC tablet 40 mg  40 mg Oral Daily Rise Patience, MD   40 mg at 08/08/21 0942   polyethylene glycol (MIRALAX / GLYCOLAX) packet 17 g  17 g Oral BID Regalado, Belkys A, MD   17 g at 08/07/21 0949   potassium chloride SA  (KLOR-CON [name]) CR tablet 40 mEq  40 mEq Oral BID Regalado, Belkys A, MD   40 mEq at 08/08/21 0943   senna-docusate (Senokot-S) tablet 1 tablet  1 tablet Oral BID Regalado, Belkys A, MD   1 tablet at 08/08/21 0948   sodium phosphate (FLEET) 7-19 GM/118ML enema 1 enema  1 enema Rectal Once Regalado, Belkys A, MD       Vitamin D (Ergocalciferol) (DRISDOL) capsule 50,000 Units  50,000 Units Oral Q7 days Regalado, Belkys A, MD   50,000 Units at 08/07/21 1200    Allergies as of 08/05/2021   (No Known Allergies)    Family History  Problem Relation Age of Onset   Diabetes Brother     Social History   Socioeconomic History   Marital status: Married    Spouse name: Not on file   Number of children: Not on file   Years of education: Not on file   Highest education level: Not on file  Occupational History   Not on file  Tobacco Use   Smoking status: Former    Types: Cigarettes    Quit date: 06/21/2005    Years since quitting: 16.1    Passive exposure: Never   Smokeless tobacco: Never  Vaping Use   Vaping Use: Never used  Substance and Sexual Activity   Alcohol use: Yes    Comment: occasional   Drug use: Yes   Sexual activity: Yes  Other Topics Concern   Not on file  Social History Narrative   Not on file   Social Determinants of Health   Financial Resource Strain: Not on file  Food Insecurity: Not on file  Transportation Needs: Not on file  Physical Activity: Not on file  Stress: Not on file  Social Connections: Not on file  Intimate Partner Violence: Not on file    Review of Systems: As per HPI, all others negative  Physical Exam: Vital signs in last 24 hours: Temp:  [97.9 F (36.6 C)-99 F (37.2 C)] 98.1 F (36.7 C) (05/19 1230) Pulse Rate:  [82-88] 86 (05/19 1230) Resp:  [18-20] 20 (05/19 1230) BP: (114-141)/(74-97) 114/97 (05/19 1230) SpO2:  [94 %-98 %] 98 % (05/19 1230) Last BM Date : 08/07/21 General:   Thin, frail, cachectic, older-appearing than stated age,  pleasant and cooperative in NAD Head:  Normocephalic and atraumatic. Eyes:  Sclera clear, no icterus.   Conjunctiva pink. Ears:  Normal auditory acuity. Nose:  No deformity, discharge,  or lesions. Mouth:  No deformity or lesions.  Oropharynx dry, poor dentition Neck:  Supple; no masses or thyromegaly. Abdomen:  Soft, nontender and nondistended. No masses, hepatosplenomegaly or hernias noted. Normal bowel sounds, without guarding, and without rebound.     Msk:  Diffuse muscular atrophy, Symmetrical, without gross deformities. Normal posture. Pulses:  Normal pulses noted. Extremities:  Without clubbing or edema. Neurologic:  Alert and  oriented x4; diffusely weak, otherwise grossly normal neurologically. Skin:  Intact without significant lesions or rashes. Psych:  Alert and cooperative. Normal mood and affect.   Lab Results: Recent Labs    08/07/21 0438 08/08/21 0412 08/08/21 1149  WBC 13.3* 14.6* 15.5*  HGB 12.9* 13.4 13.8  HCT 37.2* 40.7 40.9  PLT 252 281 273   BMET Recent Labs    08/07/21 0438 08/08/21 0412 08/08/21 1149  NA 138 135 139  K 3.4* 3.4* 3.4*  CL 99 97* 98  CO2 30 28 29   GLUCOSE 118* 127* 124*  BUN 22 22 23   CREATININE 0.68 0.95 0.86  CALCIUM 9.1 9.0 9.5   LFT Recent Labs    08/06/21 0402  PROT 6.9  ALBUMIN 3.6  AST 25  ALT 24  ALKPHOS 91  BILITOT 1.0   PT/INR No results for input(s): LABPROT, INR in the last 72 hours.  Studies/Results: DG Abd 1 View  Result Date: 08/08/2021 CLINICAL DATA:  Constipation. EXAM: ABDOMEN - 1 VIEW COMPARISON:  CT abdomen and pelvis 08/05/2021  FINDINGS: The bowel gas pattern is nonobstructive with moderate diffuse retained stool in general with improved rectal fecal burden since the CT. No radio-opaque calculi or other significant radiographic abnormality are seen. There is no supine evidence of free air. There is aortoiliac and iliofemoral calcific disease. Degenerative change of the lower lumbar spine. IMPRESSION:  Moderate constipation.  No dilated small bowel is seen. Electronically Signed   By: Telford Nab [name].D.   On: 08/08/2021 06:16    Impression:   Chest/mediastinal mass.  Pleural effusion.  Chest wall pains, likely from #1/2.  Dysphagia.  Suspect from extrinsic compression from #1 above.  Weight loss. Cachexia. Protein-calorie malnutrition.  CAD s/p PCI.  PVD with CEA.  Anticoagulation (plavix), currently on hold.  Plan:   Patient can tolerate thin liquids at this point, thicker liquids at times, and solid foods rarely.  This has been ongoing for past several weeks. Clear/thin full liquids as tolerated. I imagine that doing simple EGD in this case will not suffice. I will reach out to pulmonary team and see whether we can coordinate EBUS/bronch next week (Monday?) with endoscopy (with possible esophageal stenting, as needed) under same sedation. Will need Plavix on hold 4-5 days (last dose ? Yesterday) prior to doing above procedures. [facility] will follow.   LOS: 3 days   Alex [name]  08/08/2021, 12:46 PM  Cell [phone] If no answer or after 5 PM call [phone]

## 2021-08-08 NOTE — Progress Notes (Signed)
NAME:  Alex [name], MRN:  [id number], DOB:  Oct 20, 1950, LOS: 3 ADMISSION DATE:  08/05/2021, CONSULTATION DATE:  08/06/2021 REFERRING MD:  Niel Hummer, CHIEF COMPLAINT:  Lung pass with pleural effusion    History of Present Illness:  71 yo male former smoker had PCI with DAPT in December 2022.  He had persistent weight loss, dysphagia, and chest pain.  He had cardiac PET scan on 07/23/21.  This showed Lt lower lobe airspace disease and Lt pleural effusion, pleural plaques, emphysema, and 4 mm nodule in Rt lung.  He was treated with doxycycline.  He was seen by Dr. Ander Slade in pulmonary office.  He had left thoracentesis done with IR on 08/04/21 with 550 ml fluid removed.  Fluid analysis consistent with an exudate (LDH 511, protein 5.1, glucose 108, WBC 2340 with 82% lymphocytes, gram stain negative).  He had progressive chest pain and dyspnea after procedure and presented to the ER and was admitted to hospital.  CT chest/abd/pelvis from 08/05/21 showed: "Consolidation of medial LLL with area of cavitation.  Adjacent to and inseparable from the medial left lower lobe consolidation is a 6.0 x 4.3 x 9.6 cm area low-density mass with thickened, irregular enhancing margins, abutting and inseparable from the descending thoracic aorta. This abnormal soft tissue contacts the distal esophagus as well and extends through the diaphragmatic hiatus to abut the celiac axis and left renal vein.  1.1 cm necrotic nodule posterior Rt kidney."   Pertinent  Medical History   CAD S/P PCI with DAPT started 02/2021, GERD, HLD, anxiety, and arthritis, Pectus excavatum  Significant Hospital Events: Including procedures, antibiotic start and stop dates in addition to other pertinent events   5/16 Admitted with chest pain CTA concerning for new lung mass  5/17 PCCM consulted, plavix held 5/18 Ongoing chest/mid chest retrosternal, left lower chest discomfort   Interim History / Subjective:  Afebrile  Pt reports ongoing  chest pain. States the morphine is not really helping, hoping the oxycodone will help (just started this today per his report)  Objective   Blood pressure (!) 114/97, pulse 86, temperature 98.1 F (36.7 C), temperature source Oral, resp. rate 20, height 5\' 7"  (1.702 m), weight 48.6 kg, SpO2 98 %.        Intake/Output Summary (Last 24 hours) at 08/08/2021 1335 Last data filed at 08/08/2021 0600 Gross per 24 hour  Intake 350 ml  Output --  Net 350 ml   Filed Weights   08/05/21 0901 08/05/21 1827  Weight: 49.8 kg 48.6 kg    Examination: General: cachectic adult male lying in bed in NAD  HEENT: MM pink/moist, anicteric, wearing glasses  Neuro: AAOx4, speech clear, MAE  CV: s1s2 RRR, no m/r/g PULM: non-labored at rest, lungs bilaterally clear GI: soft, bsx4 active  Extremities: warm/dry, no edema  Skin: no rashes or lesions  Assessment & Plan:   Lt lower lung mass/consolidation with area of cavitation and pleural plaques. Rt lung nodule. -FOB tentatively planned for Monday at noon (case scheduled with Endo but may need to change time depending on coordination with GI.  -NPO after MN on 5/22 -procedure consent not yet signed but discussed general details with patient and wife -continue hold plavix  -continue abx for now - rocephin + azithro  Lt exudate pleural effusion. Negative pleural cytology from 5/15 / non-diagnostic  -follow intermittent CXR  Centrilobular emphysema. -continue incruse, breo  Coronary artery disease s/p stent in December 2022, HLD. -hold plavix  -continue ASA  Severe malnutrition with odynophagia. 1.1 cm necrotic nodule posterior Rt kidney. -Per primary / TRH    Wife updated on plan of care 5/19 via phone - reviewed plan for coordination of GI/Pulm procedures for patient to limit anesthesia.    Labs :      Latest Ref Rng & Units 08/08/2021   11:49 AM 08/08/2021    4:12 AM 08/07/2021    4:38 AM  CMP  Glucose 70 - 99 mg/dL 124   127   118     BUN 8 - 23 mg/dL 23   22   22     Creatinine 0.61 - 1.24 mg/dL 0.86   0.95   0.68    Sodium 135 - 145 mmol/L 139   135   138    Potassium 3.5 - 5.1 mmol/L 3.4   3.4   3.4    Chloride 98 - 111 mmol/L 98   97   99    CO2 22 - 32 mmol/L 29   28   30     Calcium 8.9 - 10.3 mg/dL 9.5   9.0   9.1         Latest Ref Rng & Units 08/08/2021   11:49 AM 08/08/2021    4:12 AM 08/07/2021    4:38 AM  CBC  WBC 4.0 - 10.5 K/uL 15.5   14.6   13.3    Hemoglobin 13.0 - 17.0 g/dL 13.8   13.4   12.9    Hematocrit 39.0 - 52.0 % 40.9   40.7   37.2    Platelets 150 - 400 K/uL 273   281   252      Signature:   Noe Gens, MSN, APRN, NP-C, AGACNP-BC [facility] 08/08/2021, 1:35 PM   Please see [email or web address] for pager details.   From 7A-7P if no response, please call [phone] After hours, please call ELink [phone]

## 2021-08-08 NOTE — Progress Notes (Signed)
PROGRESS NOTE    Alex [name]  GLO:[id number] DOB: [birth year] DOA: 08/05/2021 PCP: Alroy Dust, L.Marlou Sa, MD   Brief Narrative: 71 year old with past medical history significant for CAD status post stent in December 2022 on dual antiplatelet agent, presented with chest pain for some time now and had work-up done by cardiologist including a PET scan which showed pleural effusion and consolidation.  Underwent thoracentesis 2 days prior to admission and he presented with worsening chest pain. He report poor oral intake.  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  Patient was admitted for further work-up.      Assessment & Plan:   Principal Problem:   Mass in chest Active Problems:   Coronary artery disease of native artery of native heart with stable angina pectoris (HCC)   Mediastinal mass   Protein-calorie malnutrition, severe   Oral candidiasis   1-Lung mass/// superimpose PNA  Left Pleural Effusion. S/P thoracentesis 5/15 -CT angio chest: Heterogeneously enhancing consolidation in the medial left lower lobe with new small area of cavitation.  Adjacent to an inseparable from consolidation is a 6 x 4 x 9.6 cm low-density mass with thickened and irregular enhancing margins, abutting  and inseparable from the descending thoracic aorta and distal esophagus.  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 -Pulmonologist Consulted.  -Patient will need bronchoscopy, Plavix discontinue.  -Continue with Ceftriaxone and Azithro  -Follow Cytology :  Pleural fluid non diagnostic.  -Discussed with Oncology, we need to contact them when Diagnosis is established,.  -plan for Bronchoscopy on 5/22. In conjunction with endoscopy.  -Plan to start oral oxycodone. Requiring IV morphine.   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  1.1 cm necrotic nodule posterior to the right kidney. -He will need oncology consultation.   3-CAD status post stent December 2022: Underwent antiplatelet therapy Holding Plavix for bronchoscopy. Discussed with Dr. Irish Lack, ok to hold plavix prior to bronchoscopy.  Continue with Cardizem and statins  Dysphagia; Severe Malnutrition: suspect related to lung mass pressing esophagus./  Undergoing evaluation. Probably mass could be pressing the esophagus.  Started Ensure.  Unable to tolerates diet.  GI consulted, plan for endoscopy and possible esophageal stent placement on Monday.   Constipation; Continue with  senna, miralax. He had BM 5/18. Continue with laxatives.   Hypokalemia; replete orally.  Oral Candidiasis.  Started Nystatin/ fluconazole.    Estimated body mass index is 16.77 kg/m as calculated from the following:   Height as of this encounter: 5\' 7"  (1.702 m).   Weight as of this encounter: 48.6 kg.   DVT prophylaxis: SCD Code Status: Full code Family Communication: Care discussed with wife 5/19 Disposition Plan:  Status is: Inpatient Remains inpatient appropriate because: admitted with lung mass. Chest pain     Consultants:  Pulmonologist   Procedures:  CTA  Antimicrobials:    Subjective: He had BM last night.  He is not able to tolerates ensure, food gets stuck.  Still having chest pain. Requiring morphine.    Objective: Vitals:   08/07/21 1230 08/07/21 2152 08/08/21 0554 08/08/21 1230  BP: 120/78 126/74 (!) 141/88 (!) 114/97  Pulse: 78 82 88 86  Resp: (!) 24 18 18 20   Temp: 98 F (36.7 C) 97.9 F (36.6 C) 99 F (37.2 C) 98.1 F (36.7 C)  TempSrc: Oral Oral Oral Oral  SpO2:  93% 94% 96% 98%  Weight:      Height:        Intake/Output Summary (Last 24 hours) at 08/08/2021 1507 Last data filed at 08/08/2021 0600 Gross per 24 hour  Intake 350 ml  Output --  Net 350 ml   Filed Weights   08/05/21  0901 08/05/21 1827  Weight: 49.8 kg 48.6 kg    Examination:  General exam: NAD Respiratory system: CTA Cardiovascular system: S 1, S 2 RRR Gastrointestinal system: BS present , soft, nt Central nervous system: Alert, conversant.  Extremities: No edema    Data Reviewed: I have personally reviewed following labs and imaging studies  CBC: Recent Labs  Lab 08/05/21 0929 08/06/21 0402 08/07/21 0438 08/08/21 0412 08/08/21 1149  WBC 13.1* 13.3* 13.3* 14.6* 15.5*  NEUTROABS 10.3*  --   --   --   --   HGB 13.7 13.2 12.9* 13.4 13.8  HCT 41.3 40.1 37.2* 40.7 40.9  MCV 88.1 89.1 88.4 90.0 88.7  PLT 278 268 252 281 706   Basic Metabolic Panel: Recent Labs  Lab 08/05/21 0929 08/06/21 0402 08/07/21 0438 08/08/21 0412 08/08/21 1149  NA 136 136 138 135 139  K 3.2* 3.4* 3.4* 3.4* 3.4*  CL 92* 96* 99 97* 98  CO2 31 29 30 28 29   GLUCOSE 116* 119* 118* 127* 124*  BUN 21 20 22 22 23   CREATININE 0.83 0.81 0.68 0.95 0.86  CALCIUM 10.0 9.1 9.1 9.0 9.5   GFR: Estimated Creatinine Clearance: 53.4 mL/min (by C-G formula based on SCr of 0.86 mg/dL). Liver Function Tests: Recent Labs  Lab 08/05/21 0929 08/06/21 0402  AST 20 25  ALT 19 24  ALKPHOS 104 91  BILITOT 0.9 1.0  PROT 7.7 6.9  ALBUMIN 4.1 3.6   Recent Labs  Lab 08/05/21 0929  LIPASE 18   No results for input(s): AMMONIA in the last 168 hours. Coagulation Profile: No results for input(s): INR, PROTIME in the last 168 hours. Cardiac Enzymes: No results for input(s): CKTOTAL, CKMB, CKMBINDEX, TROPONINI in the last 168 hours. BNP (last 3 results) No results for input(s): PROBNP in the last 8760 hours. HbA1C: No results for input(s): HGBA1C in the last 72 hours. CBG: No results for input(s): GLUCAP in the last 168 hours. Lipid Profile: No results for input(s): CHOL, HDL, LDLCALC, TRIG, CHOLHDL, LDLDIRECT in the last 72 hours. Thyroid Function Tests: No results for input(s): TSH, T4TOTAL, FREET4, T3FREE,  THYROIDAB in the last 72 hours. Anemia Panel: Recent Labs    08/07/21 0438  VITAMINB12 562  FOLATE 8.7  FERRITIN 243  TIBC 261  IRON 40*  RETICCTPCT 0.9   Sepsis Labs: No results for input(s): PROCALCITON, LATICACIDVEN in the last 168 hours.  Recent Results (from the past 240 hour(s))  Gram stain     Status: None   Collection Time: 08/04/21 10:33 AM   Specimen: PATH Cytology Pleural fluid  Result Value Ref Range Status   Specimen Description PLEURAL FLUID  Final   Special Requests NONE  Final   Gram Stain   Final    NO WBC SEEN NO ORGANISMS SEEN Performed at [facility], [address] [place], [place] [address]    Report Status 08/04/2021 FINAL  Final  Fungus Culture With Stain     Status: None (Preliminary result)   Collection Time: 08/04/21 10:33 AM   Specimen: PATH Cytology Pleural fluid  Result Value Ref Range Status   Fungus Stain Final report  Final    Comment: (NOTE) Performed At: [facility] [place], [place] [id number] Rush Farmer MD [id number]    Fungus (Mycology) Culture PENDING  Incomplete   Fungal Source PLEURAL  Final    Comment: FLUID Performed at [facility], [place] [address] [place], [place] [address]   Acid Fast Smear (AFB)     Status: None   Collection Time: 08/04/21 10:33 AM   Specimen: PATH Cytology Pleural fluid  Result Value Ref Range Status   AFB Specimen Processing Concentration  Final   Acid Fast Smear Negative  Final    Comment: (NOTE) Performed At: [facility] [place], [place] [id number] Rush Farmer MD [id number]    Source (AFB) PLEURAL  Final    Comment: FLUID Performed at [facility], [place] [address] [place], [place] [address]   Fungus Culture Result     Status: None   Collection Time: 08/04/21 10:33 AM  Result Value Ref Range Status   Result 1 Comment  Final    Comment: (NOTE) KOH/Calcofluor preparation:  no fungus observed. Performed At: [facility] [place], [place] [id number] Rush Farmer MD IP:[id number]   Blood culture (routine x 2)     Status: None (Preliminary result)   Collection Time: 08/05/21  9:28 AM   Specimen: Right Antecubital; Blood  Result Value Ref Range Status   Specimen Description   Final    RIGHT ANTECUBITAL Performed at [facility], [address] [place], [place] [address]    Special Requests   Final    BOTTLES DRAWN AEROBIC AND ANAEROBIC Blood Culture results may not be optimal due to an inadequate volume of blood received in culture bottles Performed at [facility], [address] [place], [place] [address]    Culture   Final    NO GROWTH 3 DAYS Performed at [facility], [place] [address] [place], [place] [address]    Report Status PENDING  Incomplete  Blood culture (routine x 2)     Status: None (Preliminary result)   Collection Time: 08/05/21  9:29 AM   Specimen: BLOOD LEFT FOREARM  Result Value Ref Range Status   Specimen Description   Final    BLOOD LEFT FOREARM Performed at [facility], [address] [place], [place] [address]    Special Requests   Final    BOTTLES DRAWN AEROBIC AND ANAEROBIC Blood Culture adequate volume Performed at [facility], [address], [place], [place] [address]    Culture   Final    NO GROWTH 3 DAYS Performed at [facility], [place] [address], [place], [place] [address]    Report Status PENDING  Incomplete          Radiology Studies: DG Abd 1 View  Result Date: 08/08/2021 CLINICAL DATA:  Constipation. EXAM: ABDOMEN - 1 VIEW COMPARISON:  CT abdomen and pelvis 08/05/2021 FINDINGS: The bowel gas pattern is nonobstructive with moderate diffuse retained stool in general with improved rectal fecal burden since the CT. No radio-opaque calculi or other significant radiographic abnormality are seen. There is no supine evidence of free  air. There is aortoiliac and iliofemoral calcific disease. Degenerative change of the lower lumbar spine. IMPRESSION: Moderate constipation.  No dilated small bowel is seen. Electronically Signed   By: Telford Nab M.D.   On: 08/08/2021 06:16        Scheduled Meds:  atorvastatin  80  mg Oral Daily   diltiazem  120 mg Oral Daily   feeding supplement  1 Container Oral TID BM   feeding supplement  237 mL Oral BID BM   fluticasone furoate-vilanterol  1 puff Inhalation Daily   And   umeclidinium bromide  1 puff Inhalation Daily   mouth rinse  15 mL Mouth Rinse BID   multivitamin with minerals  1 tablet Oral Daily   nystatin  5 mL Oral QID   pantoprazole  40 mg Oral Daily   polyethylene glycol  17 g Oral BID   potassium chloride  40 mEq Oral BID   senna-docusate  1 tablet Oral BID   sodium phosphate  1 enema Rectal Once   Vitamin D (Ergocalciferol)  50,000 Units Oral Q7 days   Continuous Infusions:  azithromycin 500 mg (08/08/21 1315)   cefTRIAXone (ROCEPHIN)  IV 2 g (08/08/21 1226)   fluconazole (DIFLUCAN) IV       LOS: 3 days    Time spent: 35 minutes.     Elmarie Shiley, MD [facility]   If 7PM-7AM, please contact night-coverage [email or web address]  08/08/2021, 3:07 PM

## 2021-08-08 NOTE — Plan of Care (Signed)

## 2021-08-08 NOTE — Evaluation (Signed)
Physical Therapy Evaluation Patient Details Name: Alex [name] MRN: [id number] DOB: 15-Aug-1950 Today's Date: 08/08/2021  History of Present Illness  Patient is 71 y.o. male admitted with chest pain CTA concerning for new lung mass. He is a former smoker had PCI with DAPT in December 2022.  He had persistent weight loss, dysphagia, and chest pain.  He had cardiac PET scan on 07/23/21.  This showed Lt lower lobe airspace disease and Lt pleural effusion, pleural plaques, emphysema, and 4 mm nodule in Rt lung.  He was treated with doxycycline.  Lt thoracentesis done with IR on 08/04/21 with 550 ml fluid removed.  He had progressive chest pain and dyspnea after procedure and presented to the ER and was admitted to hospital.  CT chest/abd/pelvis from 08/05/21 showed: "Consolidation of medial LLL with area of cavitation.  Adjacent to and inseparable from the medial left lower lobe consolidation is a 6.0 x 4.3 x 9.6 cm area low-density mass with thickened, irregular enhancing margins, abutting and inseparable from the descending thoracic aorta. This abnormal soft tissue contacts the distal esophagus as well and extends through the diaphragmatic hiatus to abut the celiac axis and left renal vein. Rt kidney nodule notes as well. PMH significant for  CAD S/P PCI with DAPT started 02/2021, GERD, HLD, anxiety, and arthritis, Pectus excavatum.    Clinical Impression  Alex [name] is 71 y.o. male admitted with above HPI and diagnosis. Patient is currently limited by functional impairments below (see PT problem list). Patient lives with his spouse and is independent at baseline. Patient was able to complete bed mobility and ambulated short distance in hall with HHA min assist to steady. Patient will benefit from continued skilled PT interventions to address impairments and progress independence with mobility, recommending HHPT with assist from spouse. Acute PT will follow and progress as able.         Recommendations for follow up therapy are one component of a multi-disciplinary discharge planning process, led by the attending physician.  Recommendations may be updated based on patient status, additional functional criteria and insurance authorization.  Follow Up Recommendations Home health PT    Assistance Recommended at Discharge Intermittent Supervision/Assistance  Patient can return home with the following  A little help with walking and/or transfers;A little help with bathing/dressing/bathroom;Assistance with cooking/housework;Direct supervision/assist for medications management;Assist for transportation;Help with stairs or ramp for entrance    Equipment Recommendations None recommended by PT  Recommendations for Other Services       Functional Status Assessment Patient has had a recent decline in their functional status and demonstrates the ability to make significant improvements in function in a reasonable and predictable amount of time.     Precautions / Restrictions Precautions Precautions: Fall Restrictions Weight Bearing Restrictions: No      Mobility  Bed Mobility Overal bed mobility: Needs Assistance Bed Mobility: Supine to Sit, Sit to Supine     Supine to sit: Supervision Sit to supine: Supervision   General bed mobility comments: supervision for safety, pt taking extra time    Transfers Overall transfer level: Needs assistance Equipment used: None Transfers: Sit to/from Stand Sit to Stand: Min guard           General transfer comment: guarding for safety with rise. pt with flexed posture andunable to fully stand upright. Pt required rest break after first stand. then stood to attempt ambulation.    Ambulation/Gait Ambulation/Gait assistance: Min assist Gait Distance (Feet): 50 Feet Assistive device: 1 person hand  held assist Gait Pattern/deviations: Step-through pattern, Decreased stride length, Shuffle, Drifts right/left, Trunk  flexed Gait velocity: decr     General Gait Details: HHA to steady balance as pt drfiting Rt/Lt and posture flexed. no LOB during gait or turn with HHA.  Stairs            Wheelchair Mobility    Modified Rankin (Stroke Patients Only)       Balance                                             Pertinent Vitals/Pain Pain Assessment Pain Assessment: Faces Faces Pain Scale: Hurts even more Pain Location: back Lt>Rt Pain Descriptors / Indicators: Aching, Discomfort, Sore, Guarding Pain Intervention(s): Limited activity within patient's tolerance, Monitored during session, Repositioned, Heat applied    Home Living Family/patient expects to be discharged to:: Private residence Living Arrangements: Spouse/significant other Available Help at Discharge: Family Type of Home: House Home Access: Stairs to enter Entrance Stairs-Rails: None Entrance Stairs-Number of Steps: 1   Home Layout: Two level;Full bath on main level;Able to live on main level with bedroom/bathroom        Prior Function Prior Level of Function : Independent/Modified Independent                     Hand Dominance   Dominant Hand: Right    Extremity/Trunk Assessment             Cervical / Trunk Assessment Cervical / Trunk Assessment: Kyphotic;Other exceptions Cervical / Trunk Exceptions: due to pain  Communication   Communication: No difficulties  Cognition Arousal/Alertness: Awake/alert Behavior During Therapy: WFL for tasks assessed/performed Overall Cognitive Status: Within Functional Limits for tasks assessed                                          General Comments      Exercises     Assessment/Plan    PT Assessment Patient needs continued PT services  PT Problem List Decreased strength;Decreased range of motion;Decreased activity tolerance;Decreased balance;Decreased mobility;Decreased knowledge of precautions;Pain       PT  Treatment Interventions Gait training;DME instruction;Stair training;Functional mobility training;Therapeutic activities;Neuromuscular re-education;Balance training;Therapeutic exercise;Patient/family education    PT Goals (Current goals can be found in the Care Plan section)  Acute Rehab PT Goals Patient Stated Goal: stop hurting and get home PT Goal Formulation: With patient Time For Goal Achievement: 08/22/21 Potential to Achieve Goals: Good    Frequency Min 3X/week     Co-evaluation               AM-PAC PT "6 Clicks" Mobility  Outcome Measure Help needed turning from your back to your side while in a flat bed without using bedrails?: A Little Help needed moving from lying on your back to sitting on the side of a flat bed without using bedrails?: A Little Help needed moving to and from a bed to a chair (including a wheelchair)?: A Little Help needed standing up from a chair using your arms (e.g., wheelchair or bedside chair)?: A Little Help needed to walk in hospital room?: A Little Help needed climbing 3-5 steps with a railing? : A Lot 6 Click Score: 17    End of Session Equipment Utilized During  Treatment: Gait belt Activity Tolerance: Patient tolerated treatment well;Patient limited by pain Patient left: in chair;with call bell/phone within reach;with bed alarm set;with family/visitor present Nurse Communication: Mobility status PT Visit Diagnosis: Muscle weakness (generalized) (M62.81);Difficulty in walking, not elsewhere classified (R26.2);Unsteadiness on feet (R26.81);Other symptoms and signs involving the nervous system (R29.898)    Time: 9563-8756 PT Time Calculation (min) (ACUTE ONLY): 20 min   Charges:   PT Evaluation $PT Eval Low Complexity: 1 Low          Verner Mould, DPT Acute Rehabilitation Services Office [phone] Pager [phone]  08/08/21 12:34 PM

## 2021-08-08 NOTE — TOC Progression Note (Signed)
Transition of Care [facility]) - Progression Note    Patient Details  Name: Alex [name] MRN: [id number] Date of Birth: 09-Nov-1950  [facility] of [facility] CM/SW Contact  Purcell Mouton, RN Phone Number: 08/08/2021, 10:33 AM  Clinical Narrative:     TOC will follow for discharge needs. Pt is home with spouse.  Expected Discharge Plan: Home/Self Care Barriers to Discharge: No Barriers Identified  Expected Discharge Plan and Services Expected Discharge Plan: Home/Self Care     Post Acute Care Choice: Durable Medical Equipment, Home Health Living arrangements for the past 2 months: Single Family Home                                       Social Determinants of Health (SDOH) Interventions    Readmission Risk Interventions     View : No data to display.

## 2021-08-08 NOTE — Progress Notes (Signed)
Notified on call provider about patient refusing nighttime potassium.

## 2021-08-08 NOTE — Plan of Care (Signed)

## 2021-08-09 DIAGNOSIS — R222 Localized swelling, mass and lump, trunk: Secondary | ICD-10-CM | POA: Diagnosis not present

## 2021-08-09 LAB — ZINC: Zinc: 59 ug/dL (ref 44–115)

## 2021-08-09 LAB — CBC
HCT: 41.1 % (ref 39.0–52.0)
Hemoglobin: 13.3 g/dL (ref 13.0–17.0)
MCH: 29.2 pg (ref 26.0–34.0)
MCHC: 32.4 g/dL (ref 30.0–36.0)
MCV: 90.3 fL (ref 80.0–100.0)
Platelets: 281 10*3/uL (ref 150–400)
RBC: 4.55 MIL/uL (ref 4.22–5.81)
RDW: 12.6 % (ref 11.5–15.5)
WBC: 14.6 10*3/uL — ABNORMAL HIGH (ref 4.0–10.5)
nRBC: 0 % (ref 0.0–0.2)

## 2021-08-09 LAB — BASIC METABOLIC PANEL
Anion gap: 8 (ref 5–15)
BUN: 24 mg/dL — ABNORMAL HIGH (ref 8–23)
CO2: 30 mmol/L (ref 22–32)
Calcium: 9.3 mg/dL (ref 8.9–10.3)
Chloride: 101 mmol/L (ref 98–111)
Creatinine, Ser: 0.75 mg/dL (ref 0.61–1.24)
GFR, Estimated: >60 mL/min (ref >60)
Glucose, Bld: 107 mg/dL — ABNORMAL HIGH (ref 70–99)
Potassium: 4.4 mmol/L (ref 3.5–5.1)
Sodium: 139 mmol/L (ref 135–145)

## 2021-08-09 MED ORDER — LACTATED RINGERS IV SOLN: INTRAVENOUS | Status: DC

## 2021-08-09 NOTE — Plan of Care (Signed)

## 2021-08-09 NOTE — Progress Notes (Signed)
PROGRESS NOTE    Alex [name]  HYQ:[id number] DOB: 03/28/1950 DOA: 08/05/2021 PCP: Alroy Dust, L.Marlou Sa, MD   Brief Narrative: 71 year old with past medical history significant for CAD status post stent in December 2022 on dual antiplatelet agent, presented with chest pain for some time now and had work-up done by cardiologist including a PET scan which showed pleural effusion and consolidation.  Underwent thoracentesis 2 days prior to admission and he presented with worsening chest pain. He report poor oral intake.  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  Patient was admitted for further work-up.      Assessment & Plan:   Principal Problem:   Mass in chest Active Problems:   Coronary artery disease of native artery of native heart with stable angina pectoris (HCC)   Mediastinal mass   Protein-calorie malnutrition, severe   Oral candidiasis   1-Lung mass/// superimpose PNA  Left Pleural Effusion. S/P thoracentesis 5/15 -CT angio chest: Heterogeneously enhancing consolidation in the medial left lower lobe with new small area of cavitation.  Adjacent to an inseparable from consolidation is a 6 x 4 x 9.6 cm low-density mass with thickened and irregular enhancing margins, abutting  and inseparable from the descending thoracic aorta and distal esophagus.  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 -Pulmonologist Consulted.  -Patient will need bronchoscopy, Plavix discontinue.  -Continue with Ceftriaxone and Azithro  -Follow Cytology :  Pleural fluid non diagnostic.  -Discussed with Oncology, we need to contact them when Diagnosis is established,.  -Plan for Bronchoscopy on 5/22. In conjunction with endoscopy.  -Plan to start oral oxycodone. Requiring IV morphine.   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  1.1 cm necrotic nodule posterior to the right kidney. -He will need oncology consultation.   3-CAD status post stent December 2022: Underwent antiplatelet therapy Holding Plavix for bronchoscopy. Discussed with Dr. Irish Lack, ok to hold plavix prior to bronchoscopy.  Continue with Cardizem and statins  Dysphagia; Severe Malnutrition: suspect related to lung mass pressing esophagus./  Undergoing evaluation. Probably mass could be pressing the esophagus.  Started Ensure.  Unable to tolerates diet.  GI consulted, plan for endoscopy and possible esophageal stent placement on Monday.   Constipation; Continue with  senna, miralax. He had BM 5/18. Continue with laxatives.   Hypokalemia; replete orally.  Oral Candidiasis.  Started Nystatin/ fluconazole.    Estimated body mass index is 16.77 kg/m as calculated from the following:   Height as of this encounter: 5\' 7"  (1.702 m).   Weight as of this encounter: 48.6 kg.   DVT prophylaxis: SCD Code Status: Full code Family Communication: Care discussed with wife 5/20 Disposition Plan:  Status is: Inpatient Remains inpatient appropriate because: admitted with lung mass. Chest pain     Consultants:  Pulmonologist   Procedures:  CTA  Antimicrobials:    Subjective: No BM . He report oxycodone has help with pain.  Minimal oral intake.   Objective: Vitals:   08/08/21 1230 08/08/21 2054 08/09/21 0623 08/09/21 0822  BP: (!) 114/97 130/76 123/82   Pulse: 86 79 82   Resp: 20 19 20    Temp: 98.1 F (36.7 C) 98.1 F (36.7 C) 98.5 F (36.9 C)   TempSrc: Oral Oral Oral   SpO2: 98% 97% 98% 98%  Weight:      Height:  Intake/Output Summary (Last 24 hours) at 08/09/2021 1439 Last data filed at 08/09/2021 0600 Gross per 24 hour  Intake 760 ml  Output --  Net 760 ml    Filed Weights   08/05/21 0901 08/05/21 1827  Weight: 49.8 kg 48.6 kg    Examination:  General exam:  NAD Respiratory system: CTA Cardiovascular system: S 1,  s  2 RRR Gastrointestinal system: BS present, soft, nt Central nervous system: Alert Extremities: No edema    Data Reviewed: I have personally reviewed following labs and imaging studies  CBC: Recent Labs  Lab 08/05/21 0929 08/06/21 0402 08/07/21 0438 08/08/21 0412 08/08/21 1149 08/09/21 0819  WBC 13.1* 13.3* 13.3* 14.6* 15.5* 14.6*  NEUTROABS 10.3*  --   --   --   --   --   HGB 13.7 13.2 12.9* 13.4 13.8 13.3  HCT 41.3 40.1 37.2* 40.7 40.9 41.1  MCV 88.1 89.1 88.4 90.0 88.7 90.3  PLT 278 268 252 281 273 053    Basic Metabolic Panel: Recent Labs  Lab 08/06/21 0402 08/07/21 0438 08/08/21 0412 08/08/21 1149 08/09/21 0819  NA 136 138 135 139 139  K 3.4* 3.4* 3.4* 3.4* 4.4  CL 96* 99 97* 98 101  CO2 29 30 28 29 30   GLUCOSE 119* 118* 127* 124* 107*  BUN 20 22 22 23  24*  CREATININE 0.81 0.68 0.95 0.86 0.75  CALCIUM 9.1 9.1 9.0 9.5 9.3    GFR: Estimated Creatinine Clearance: 57.4 mL/min (by C-G formula based on SCr of 0.75 mg/dL). Liver Function Tests: Recent Labs  Lab 08/05/21 0929 08/06/21 0402  AST 20 25  ALT 19 24  ALKPHOS 104 91  BILITOT 0.9 1.0  PROT 7.7 6.9  ALBUMIN 4.1 3.6    Recent Labs  Lab 08/05/21 0929  LIPASE 18    No results for input(s): AMMONIA in the last 168 hours. Coagulation Profile: No results for input(s): INR, PROTIME in the last 168 hours. Cardiac Enzymes: No results for input(s): CKTOTAL, CKMB, CKMBINDEX, TROPONINI in the last 168 hours. BNP (last 3 results) No results for input(s): PROBNP in the last 8760 hours. HbA1C: No results for input(s): HGBA1C in the last 72 hours. CBG: No results for input(s): GLUCAP in the last 168 hours. Lipid Profile: No results for input(s): CHOL, HDL, LDLCALC, TRIG, CHOLHDL, LDLDIRECT in the last 72 hours. Thyroid Function Tests: No results for input(s): TSH, T4TOTAL, FREET4, T3FREE, THYROIDAB in the last 72 hours. Anemia  Panel: Recent Labs    08/07/21 0438  VITAMINB12 562  FOLATE 8.7  FERRITIN 243  TIBC 261  IRON 40*  RETICCTPCT 0.9    Sepsis Labs: No results for input(s): PROCALCITON, LATICACIDVEN in the last 168 hours.  Recent Results (from the past 240 hour(s))  Gram stain     Status: None   Collection Time: 08/04/21 10:33 AM   Specimen: PATH Cytology Pleural fluid  Result Value Ref Range Status   Specimen Description PLEURAL FLUID  Final   Special Requests NONE  Final   Gram Stain   Final    NO WBC SEEN NO ORGANISMS SEEN Performed at [facility], [address], [place], [place] [address]    Report Status 08/04/2021 FINAL  Final  Fungus Culture With Stain     Status: None (Preliminary result)   Collection Time: 08/04/21 10:33 AM   Specimen: PATH Cytology Pleural fluid  Result Value Ref Range Status   Fungus Stain Final report  Final  Comment: (NOTE) Performed At: [facility] [place], [place] [id number] Rush Farmer MD [id number]    Fungus (Mycology) Culture PENDING  Incomplete   Fungal Source PLEURAL  Final    Comment: FLUID Performed at [facility], [place] [address] [place], [place] [address]   Acid Fast Smear (AFB)     Status: None   Collection Time: 08/04/21 10:33 AM   Specimen: PATH Cytology Pleural fluid  Result Value Ref Range Status   AFB Specimen Processing Concentration  Final   Acid Fast Smear Negative  Final    Comment: (NOTE) Performed At: [facility] [place], [place] [id number] Rush Farmer MD [id number]    Source (AFB) PLEURAL  Final    Comment: FLUID Performed at [facility], [place] [address] [place], [place] [address]   Fungus Culture Result     Status: None   Collection Time: 08/04/21 10:33 AM  Result Value Ref Range Status   Result 1 Comment  Final    Comment: (NOTE) KOH/Calcofluor preparation:  no fungus observed. Performed At: [facility] [place], [place] [id number] Rush Farmer MD [id number]   Blood culture (routine x 2)     Status: None (Preliminary result)   Collection Time: 08/05/21  9:28 AM   Specimen: Right Antecubital; Blood  Result Value Ref Range Status   Specimen Description   Final    RIGHT ANTECUBITAL Performed at [facility], [address], [place], [place] [address]    Special Requests   Final    BOTTLES DRAWN AEROBIC AND ANAEROBIC Blood Culture results may not be optimal due to an inadequate volume of blood received in culture bottles Performed at [facility], [address] [place], [place] [address]    Culture   Final    NO GROWTH 4 DAYS Performed at [facility], [place] [address] [place], [place] [address]    Report Status PENDING  Incomplete  Blood culture (routine x 2)     Status: None (Preliminary result)   Collection Time: 08/05/21  9:29 AM   Specimen: BLOOD LEFT FOREARM  Result Value Ref Range Status   Specimen Description   Final    BLOOD LEFT FOREARM Performed at [facility], [address] [place], [place] [address]    Special Requests   Final    BOTTLES DRAWN AEROBIC AND ANAEROBIC Blood Culture adequate volume Performed at [facility], [address], [place], [place] [address]    Culture   Final    NO GROWTH 4 DAYS Performed at [facility], [place] [address] [place], [place] [address]    Report Status PENDING  Incomplete          Radiology Studies: DG Abd 1 View  Result Date: 08/08/2021 CLINICAL DATA:  Constipation. EXAM: ABDOMEN - 1 VIEW COMPARISON:  CT abdomen and pelvis 08/05/2021 FINDINGS: The bowel gas pattern is nonobstructive with moderate diffuse retained stool in general with improved rectal fecal burden since the CT. No radio-opaque calculi or other significant radiographic abnormality are seen. There is no supine evidence of free air. There is aortoiliac and  iliofemoral calcific disease. Degenerative change of the lower lumbar spine. IMPRESSION: Moderate constipation.  No dilated small bowel is seen. Electronically Signed   By: Telford Nab M.D.   On: 08/08/2021 06:16        Scheduled Meds:  atorvastatin  80 mg Oral Daily  diltiazem  120 mg Oral Daily   feeding supplement  1 Container Oral TID BM   feeding supplement  237 mL Oral BID BM   fluticasone furoate-vilanterol  1 puff Inhalation Daily   And   umeclidinium bromide  1 puff Inhalation Daily   mouth rinse  15 mL Mouth Rinse BID   multivitamin with minerals  1 tablet Oral Daily   nystatin  5 mL Oral QID   pantoprazole  40 mg Oral Daily   polyethylene glycol  17 g Oral BID   senna-docusate  1 tablet Oral BID   sodium phosphate  1 enema Rectal Once   Vitamin D (Ergocalciferol)  50,000 Units Oral Q7 days   Continuous Infusions:  azithromycin 500 mg (08/09/21 1227)   cefTRIAXone (ROCEPHIN)  IV 2 g (08/09/21 1135)   fluconazole (DIFLUCAN) IV 100 mg (08/09/21 1351)     LOS: 4 days    Time spent: 35 minutes.     Elmarie Shiley, MD [facility]   If 7PM-7AM, please contact night-coverage [email or web address]  08/09/2021, 2:39 PM

## 2021-08-09 NOTE — Progress Notes (Signed)
Subjective: Chest wall pain.  Objective: Vital signs in last 24 hours: Temp:  [98.1 F (36.7 C)-98.5 F (36.9 C)] 98.5 F (36.9 C) (05/20 0623) Pulse Rate:  [79-86] 82 (05/20 0623) Resp:  [19-20] 20 (05/20 0623) BP: (114-130)/(76-97) 123/82 (05/20 0623) SpO2:  [97 %-98 %] 98 % (05/20 0822) Weight change:  Last BM Date : 08/07/21  PE: GEN:  Thin, cachectic, older-appearing than stated age  Lab Results: CBC    Component Value Date/Time   WBC 14.6 (H) 08/09/2021 0819   RBC 4.55 08/09/2021 0819   HGB 13.3 08/09/2021 0819   HGB 14.8 02/25/2021 1051   HCT 41.1 08/09/2021 0819   HCT 43.3 02/25/2021 1051   PLT 281 08/09/2021 0819   PLT 167 02/25/2021 1051   MCV 90.3 08/09/2021 0819   MCV 92 02/25/2021 1051   MCH 29.2 08/09/2021 0819   MCHC 32.4 08/09/2021 0819   RDW 12.6 08/09/2021 0819   RDW 13.0 02/25/2021 1051   LYMPHSABS 1.3 08/05/2021 0929   MONOABS 1.0 08/05/2021 0929   EOSABS 0.4 08/05/2021 0929   BASOSABS 0.1 08/05/2021 0929  CMP     Component Value Date/Time   NA 139 08/08/2021 1149   NA 138 02/25/2021 1051   K 3.4 (L) 08/08/2021 1149   CL 98 08/08/2021 1149   CO2 29 08/08/2021 1149   GLUCOSE 124 (H) 08/08/2021 1149   BUN 23 08/08/2021 1149   BUN 18 02/25/2021 1051   CREATININE 0.86 08/08/2021 1149   CALCIUM 9.5 08/08/2021 1149   PROT 6.9 08/06/2021 0402   ALBUMIN 3.6 08/06/2021 0402   AST 25 08/06/2021 0402   ALT 24 08/06/2021 0402   ALKPHOS 91 08/06/2021 0402   BILITOT 1.0 08/06/2021 0402   GFRNONAA >60 08/08/2021 1149   GFRAA  07/03/2009 0406    >60        The eGFR has been calculated using the MDRD equation. This calculation has not been validated in all clinical situations. eGFR's persistently <60 mL/min signify possible Chronic Kidney Disease.    Assessment:  Chest/mediastinal mass.  Pleural effusion.  Chest wall pains, likely from #1/2.  Dysphagia.  Suspect from extrinsic compression from #1 above.  Weight  loss. Cachexia. Protein-calorie malnutrition.  CAD s/p PCI.  PVD with CEA.  Anticoagulation (plavix), currently on hold.  Plan:   Clear/think full liquids as tolerated. Anticoagulation on hold. Plan for EGD to follow, and under same sedation as, bronch/EBUS Monday. [facility] will revisit Monday.   Landry Dyke 08/09/2021, 9:09 AM   Cell [phone] If no answer or after 5 PM call [phone]

## 2021-08-10 DIAGNOSIS — R222 Localized swelling, mass and lump, trunk: Secondary | ICD-10-CM | POA: Diagnosis not present

## 2021-08-10 LAB — CULTURE, BLOOD (ROUTINE X 2)
Culture: NO GROWTH
Culture: NO GROWTH
Special Requests: ADEQUATE

## 2021-08-10 NOTE — Progress Notes (Signed)
PROGRESS NOTE    Alex [name]  VPX:[id number] DOB: 07-25-1950 DOA: 08/05/2021 PCP: Alroy Dust, L.Marlou Sa, MD   Brief Narrative: 71 year old with past medical history significant for CAD status post stent in December 2022 on dual antiplatelet agent, presented with chest pain for some time now and had work-up done by cardiologist including a PET scan which showed pleural effusion and consolidation.  Underwent thoracentesis 2 days prior to admission and he presented with worsening chest pain. He report poor oral intake.  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  Patient was admitted for further work-up.    Assessment & Plan:   Principal Problem:   Mass in chest Active Problems:   Coronary artery disease of native artery of native heart with stable angina pectoris (HCC)   Mediastinal mass   Protein-calorie malnutrition, severe   Oral candidiasis   1-Lung mass/// superimpose PNA  Left Pleural Effusion. S/P thoracentesis 5/15 -CT angio chest: Heterogeneously enhancing consolidation in the medial left lower lobe with new small area of cavitation.  Adjacent to an inseparable from consolidation is a 6 x 4 x 9.6 cm low-density mass with thickened and irregular enhancing margins, abutting  and inseparable from the descending thoracic aorta and distal esophagus.  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 -Pulmonologist Consulted.  -Patient will need bronchoscopy, Plavix discontinue.  -Continue with Ceftriaxone and Azithro  -Follow Cytology :  Pleural fluid non diagnostic.  -Discussed with Oncology, we need to contact them when Diagnosis is established,.  -Plan for Bronchoscopy on 5/22. In conjunction with endoscopy.  -Continue with  oral oxycodone. Requiring less IV morphine.   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  1.1 cm necrotic nodule posterior to the right kidney. -He will need oncology consultation.   3-CAD status post stent December 2022: Underwent antiplatelet therapy Holding Plavix for bronchoscopy. Discussed with Dr. Irish Lack, ok to hold plavix prior to bronchoscopy.  Continue with Cardizem and statins  Dysphagia; Severe Malnutrition: suspect related to lung mass pressing esophagus./  Undergoing evaluation. Probably mass could be pressing the esophagus.  Started Ensure.  Unable to tolerates diet.  GI consulted, plan for endoscopy and possible esophageal stent placement on Monday.   Constipation; Continue with  senna, miralax. He had BM 5/18. Continue with laxatives.   Hypokalemia; replete orally.  Oral Candidiasis.  Continue with  Nystatin/ fluconazole.    Estimated body mass index is 16.77 kg/m as calculated from the following:   Height as of this encounter: 5\' 7"  (1.702 m).   Weight as of this encounter: 48.6 kg.   DVT prophylaxis: SCD Code Status: Full code Family Communication: Care discussed with wife 5/20 Disposition Plan:  Status is: Inpatient Remains inpatient appropriate because: admitted with lung mass. Chest pain     Consultants:  Pulmonologist   Procedures:  CTA  Antimicrobials:    Subjective: Pain is better controlled.    Objective: Vitals:   08/09/21 1456 08/09/21 1956 08/10/21 0431 08/10/21 0826  BP: 109/67 119/70 (!) 151/74   Pulse: 88 80 84   Resp: 19 16 16    Temp: 98.2 F (36.8 C) 98 F (36.7 C) 98 F (36.7 C)   TempSrc:  Oral Oral   SpO2: 94% 93% 91% 97%  Weight:      Height:        Intake/Output Summary (Last 24  hours) at 08/10/2021 1344 Last data filed at 08/10/2021 0419 Gross per 24 hour  Intake 1774.61 ml  Output --  Net 1774.61 ml    Filed Weights   08/05/21 0901 08/05/21 1827  Weight: 49.8 kg 48.6 kg    Examination:  General exam: NAD Respiratory system:  CTA Cardiovascular system: S 1, S 2 RRR Gastrointestinal system: BS present, soft, nt Central nervous system: Alert Extremities: No edema    Data Reviewed: I have personally reviewed following labs and imaging studies  CBC: Recent Labs  Lab 08/05/21 0929 08/06/21 0402 08/07/21 0438 08/08/21 0412 08/08/21 1149 08/09/21 0819  WBC 13.1* 13.3* 13.3* 14.6* 15.5* 14.6*  NEUTROABS 10.3*  --   --   --   --   --   HGB 13.7 13.2 12.9* 13.4 13.8 13.3  HCT 41.3 40.1 37.2* 40.7 40.9 41.1  MCV 88.1 89.1 88.4 90.0 88.7 90.3  PLT 278 268 252 281 273 741    Basic Metabolic Panel: Recent Labs  Lab 08/06/21 0402 08/07/21 0438 08/08/21 0412 08/08/21 1149 08/09/21 0819  NA 136 138 135 139 139  K 3.4* 3.4* 3.4* 3.4* 4.4  CL 96* 99 97* 98 101  CO2 29 30 28 29 30   GLUCOSE 119* 118* 127* 124* 107*  BUN 20 22 22 23  24*  CREATININE 0.81 0.68 0.95 0.86 0.75  CALCIUM 9.1 9.1 9.0 9.5 9.3    GFR: Estimated Creatinine Clearance: 57.4 mL/min (by C-G formula based on SCr of 0.75 mg/dL). Liver Function Tests: Recent Labs  Lab 08/05/21 0929 08/06/21 0402  AST 20 25  ALT 19 24  ALKPHOS 104 91  BILITOT 0.9 1.0  PROT 7.7 6.9  ALBUMIN 4.1 3.6    Recent Labs  Lab 08/05/21 0929  LIPASE 18    No results for input(s): AMMONIA in the last 168 hours. Coagulation Profile: No results for input(s): INR, PROTIME in the last 168 hours. Cardiac Enzymes: No results for input(s): CKTOTAL, CKMB, CKMBINDEX, TROPONINI in the last 168 hours. BNP (last 3 results) No results for input(s): PROBNP in the last 8760 hours. HbA1C: No results for input(s): HGBA1C in the last 72 hours. CBG: No results for input(s): GLUCAP in the last 168 hours. Lipid Profile: No results for input(s): CHOL, HDL, LDLCALC, TRIG, CHOLHDL, LDLDIRECT in the last 72 hours. Thyroid Function Tests: No results for input(s): TSH, T4TOTAL, FREET4, T3FREE, THYROIDAB in the last 72 hours. Anemia Panel: No results for input(s):  VITAMINB12, FOLATE, FERRITIN, TIBC, IRON, RETICCTPCT in the last 72 hours.  Sepsis Labs: No results for input(s): PROCALCITON, LATICACIDVEN in the last 168 hours.  Recent Results (from the past 240 hour(s))  Gram stain     Status: None   Collection Time: 08/04/21 10:33 AM   Specimen: PATH Cytology Pleural fluid  Result Value Ref Range Status   Specimen Description PLEURAL FLUID  Final   Special Requests NONE  Final   Gram Stain   Final    NO WBC SEEN NO ORGANISMS SEEN Performed at [facility], [address] [place], [place] [address]    Report Status 08/04/2021 FINAL  Final  Fungus Culture With Stain     Status: None (Preliminary result)   Collection Time: 08/04/21 10:33 AM   Specimen: PATH Cytology Pleural fluid  Result Value Ref Range Status   Fungus Stain Final report  Final    Comment: (NOTE) Performed At: [facility] [place], [place] [id number] Rush Farmer MD [id number]  Fungus (Mycology) Culture PENDING  Incomplete   Fungal Source PLEURAL  Final    Comment: FLUID Performed at [facility], [place] [address], [place], [place] [address]   Acid Fast Smear (AFB)     Status: None   Collection Time: 08/04/21 10:33 AM   Specimen: PATH Cytology Pleural fluid  Result Value Ref Range Status   AFB Specimen Processing Concentration  Final   Acid Fast Smear Negative  Final    Comment: (NOTE) Performed At: [facility] [place], [place] [id number] Rush Farmer MD [id number]    Source (AFB) PLEURAL  Final    Comment: FLUID Performed at [facility], [place] [address], [place], [place] [address]   Fungus Culture Result     Status: None   Collection Time: 08/04/21 10:33 AM  Result Value Ref Range Status   Result 1 Comment  Final    Comment: (NOTE) KOH/Calcofluor preparation:  no fungus observed. Performed At: [facility] [place], [place] [id number] Rush Farmer MD  [id number]   Blood culture (routine x 2)     Status: None   Collection Time: 08/05/21  9:28 AM   Specimen: Right Antecubital; Blood  Result Value Ref Range Status   Specimen Description   Final    RIGHT ANTECUBITAL Performed at [facility], [address] [place], [place] [address]    Special Requests   Final    BOTTLES DRAWN AEROBIC AND ANAEROBIC Blood Culture results may not be optimal due to an inadequate volume of blood received in culture bottles Performed at [facility], [address] [place], [place] [address]    Culture   Final    NO GROWTH 5 DAYS Performed at [facility], [place] [address] [place], [place] [address]    Report Status 08/10/2021 FINAL  Final  Blood culture (routine x 2)     Status: None   Collection Time: 08/05/21  9:29 AM   Specimen: BLOOD LEFT FOREARM  Result Value Ref Range Status   Specimen Description   Final    BLOOD LEFT FOREARM Performed at [facility], [address], [place], [place] [address]    Special Requests   Final    BOTTLES DRAWN AEROBIC AND ANAEROBIC Blood Culture adequate volume Performed at [facility], [address], [place], [place] [address]    Culture   Final    NO GROWTH 5 DAYS Performed at [facility], [place] [address], [place], [place] [address]    Report Status 08/10/2021 FINAL  Final          Radiology Studies: No results found.      Scheduled Meds:  atorvastatin  80 mg Oral Daily   diltiazem  120 mg Oral Daily   feeding supplement  1 Container Oral TID BM   feeding supplement  237 mL Oral BID BM   fluticasone furoate-vilanterol  1 puff Inhalation Daily   And   umeclidinium bromide  1 puff Inhalation Daily   mouth rinse  15 mL Mouth Rinse BID   multivitamin with minerals  1 tablet Oral Daily   nystatin  5 mL Oral QID   pantoprazole  40 mg Oral Daily   polyethylene glycol  17 g Oral BID   senna-docusate   1 tablet Oral BID   sodium phosphate  1 enema Rectal Once   Vitamin D (Ergocalciferol)  50,000 Units Oral Q7 days  Continuous Infusions:  azithromycin 500 mg (08/09/21 1227)   cefTRIAXone (ROCEPHIN)  IV 2 g (08/10/21 1248)   fluconazole (DIFLUCAN) IV 100 mg (08/09/21 1351)   lactated ringers 75 mL/hr at 08/10/21 0419     LOS: 5 days    Time spent: 35 minutes.     Elmarie Shiley, MD [facility]   If 7PM-7AM, please contact night-coverage [email or web address]  08/10/2021, 1:44 PM

## 2021-08-11 ENCOUNTER — Inpatient Hospital Stay (HOSPITAL_COMMUNITY): Payer: Medicare Other | Admitting: Certified Registered Nurse Anesthetist

## 2021-08-11 ENCOUNTER — Encounter (HOSPITAL_COMMUNITY)
Admission: EM | Disposition: A | Discharge status: Home / Self Care | Payer: Self-pay | Source: Home / Self Care | Attending: Internal Medicine

## 2021-08-11 ENCOUNTER — Encounter (HOSPITAL_COMMUNITY): Payer: Self-pay | Admitting: Internal Medicine

## 2021-08-11 DIAGNOSIS — Z87891 Personal history of nicotine dependence: Secondary | ICD-10-CM

## 2021-08-11 DIAGNOSIS — R918 Other nonspecific abnormal finding of lung field: Secondary | ICD-10-CM

## 2021-08-11 DIAGNOSIS — I739 Peripheral vascular disease, unspecified: Secondary | ICD-10-CM

## 2021-08-11 DIAGNOSIS — R222 Localized swelling, mass and lump, trunk: Secondary | ICD-10-CM | POA: Diagnosis not present

## 2021-08-11 DIAGNOSIS — I2510 Atherosclerotic heart disease of native coronary artery without angina pectoris: Secondary | ICD-10-CM

## 2021-08-11 DIAGNOSIS — K449 Diaphragmatic hernia without obstruction or gangrene: Secondary | ICD-10-CM

## 2021-08-11 HISTORY — PX: BRONCHIAL NEEDLE ASPIRATION BIOPSY: SHX5106

## 2021-08-11 HISTORY — PX: ENDOBRONCHIAL ULTRASOUND: SHX5096

## 2021-08-11 HISTORY — PX: VIDEO BRONCHOSCOPY: SHX5072

## 2021-08-11 HISTORY — PX: ESOPHAGOGASTRODUODENOSCOPY (EGD) WITH PROPOFOL: SHX5813

## 2021-08-11 LAB — COMPREHENSIVE METABOLIC PANEL
ALT: 22 U/L (ref 0–44)
AST: 22 U/L (ref 15–41)
Albumin: 3.1 g/dL — ABNORMAL LOW (ref 3.5–5.0)
Alkaline Phosphatase: 83 U/L (ref 38–126)
Anion gap: 9 (ref 5–15)
BUN: 14 mg/dL (ref 8–23)
CO2: 28 mmol/L (ref 22–32)
Calcium: 8.8 mg/dL — ABNORMAL LOW (ref 8.9–10.3)
Chloride: 99 mmol/L (ref 98–111)
Creatinine, Ser: 0.74 mg/dL (ref 0.61–1.24)
GFR, Estimated: >60 mL/min (ref >60)
Glucose, Bld: 106 mg/dL — ABNORMAL HIGH (ref 70–99)
Potassium: 3.3 mmol/L — ABNORMAL LOW (ref 3.5–5.1)
Sodium: 136 mmol/L (ref 135–145)
Total Bilirubin: 0.9 mg/dL (ref 0.3–1.2)
Total Protein: 6.6 g/dL (ref 6.5–8.1)

## 2021-08-11 LAB — CBC
HCT: 40.6 % (ref 39.0–52.0)
Hemoglobin: 13.5 g/dL (ref 13.0–17.0)
MCH: 29.6 pg (ref 26.0–34.0)
MCHC: 33.3 g/dL (ref 30.0–36.0)
MCV: 89 fL (ref 80.0–100.0)
Platelets: 287 10*3/uL (ref 150–400)
RBC: 4.56 MIL/uL (ref 4.22–5.81)
RDW: 12.6 % (ref 11.5–15.5)
WBC: 14.8 10*3/uL — ABNORMAL HIGH (ref 4.0–10.5)
nRBC: 0 % (ref 0.0–0.2)

## 2021-08-11 LAB — PROTIME-INR
INR: 1.3 — ABNORMAL HIGH (ref 0.8–1.2)
Prothrombin Time: 15.6 seconds — ABNORMAL HIGH (ref 11.4–15.2)

## 2021-08-11 SURGERY — ESOPHAGOGASTRODUODENOSCOPY (EGD) WITH PROPOFOL
Anesthesia: Monitor Anesthesia Care

## 2021-08-11 SURGERY — ENDOBRONCHIAL ULTRASOUND (EBUS)
Anesthesia: General

## 2021-08-11 MED ORDER — MIDAZOLAM HCL 5 MG/5ML IJ SOLN
INTRAMUSCULAR | Status: DC | PRN
  Administered 2021-08-11 (×2): 1 mg via INTRAVENOUS

## 2021-08-11 MED ORDER — PROPOFOL 10 MG/ML IV BOLUS
INTRAVENOUS | Status: AC
  Filled 2021-08-11: qty 20

## 2021-08-11 MED ORDER — ONDANSETRON HCL 4 MG/2ML IJ SOLN
INTRAMUSCULAR | Status: DC | PRN
  Administered 2021-08-11: 4 mg via INTRAVENOUS

## 2021-08-11 MED ORDER — PHENYLEPHRINE 80 MCG/ML (10ML) SYRINGE FOR IV PUSH (FOR BLOOD PRESSURE SUPPORT)
PREFILLED_SYRINGE | INTRAVENOUS | Status: DC | PRN
  Administered 2021-08-11: 120 ug via INTRAVENOUS
  Administered 2021-08-11: 80 ug via INTRAVENOUS
  Administered 2021-08-11 (×2): 120 ug via INTRAVENOUS
  Administered 2021-08-11: 80 ug via INTRAVENOUS

## 2021-08-11 MED ORDER — SUGAMMADEX SODIUM 200 MG/2ML IV SOLN
INTRAVENOUS | Status: DC | PRN
  Administered 2021-08-11: 200 mg via INTRAVENOUS

## 2021-08-11 MED ORDER — DEXAMETHASONE SODIUM PHOSPHATE 4 MG/ML IJ SOLN
INTRAMUSCULAR | Status: DC | PRN
  Administered 2021-08-11: 5 mg via INTRAVENOUS

## 2021-08-11 MED ORDER — PROPOFOL 10 MG/ML IV BOLUS
INTRAVENOUS | Status: DC | PRN
  Administered 2021-08-11: 80 mg via INTRAVENOUS

## 2021-08-11 MED ORDER — FENTANYL CITRATE (PF) 100 MCG/2ML IJ SOLN
INTRAMUSCULAR | Status: DC | PRN
  Administered 2021-08-11: 100 ug via INTRAVENOUS

## 2021-08-11 MED ORDER — POTASSIUM CHLORIDE 10 MEQ/100ML IV SOLN
10.0000 meq | INTRAVENOUS | Status: AC
  Administered 2021-08-11 (×3): 10 meq via INTRAVENOUS
  Filled 2021-08-11 (×3): qty 100

## 2021-08-11 MED ORDER — FENTANYL CITRATE (PF) 100 MCG/2ML IJ SOLN
INTRAMUSCULAR | Status: AC
  Filled 2021-08-11: qty 2

## 2021-08-11 MED ORDER — MIDAZOLAM HCL 2 MG/2ML IJ SOLN
INTRAMUSCULAR | Status: AC
  Filled 2021-08-11: qty 2

## 2021-08-11 MED ORDER — LIDOCAINE 2% (20 MG/ML) 5 ML SYRINGE
INTRAMUSCULAR | Status: DC | PRN
  Administered 2021-08-11: 60 mg via INTRAVENOUS

## 2021-08-11 MED ORDER — ROCURONIUM BROMIDE 10 MG/ML (PF) SYRINGE
PREFILLED_SYRINGE | INTRAVENOUS | Status: DC | PRN
  Administered 2021-08-11: 80 mg via INTRAVENOUS

## 2021-08-11 NOTE — Progress Notes (Signed)
NAME:  Alex [name], MRN:  [id number], DOB:  1950/06/15, LOS: 6 ADMISSION DATE:  08/05/2021, CONSULTATION DATE:  08/06/2021 REFERRING MD:  Niel Hummer, CHIEF COMPLAINT:  Lung pass with pleural effusion    History of Present Illness:  71 yo male former smoker had PCI with DAPT in December 2022.  He had persistent weight loss, dysphagia, and chest pain.  He had cardiac PET scan on 07/23/21.  This showed Lt lower lobe airspace disease and Lt pleural effusion, pleural plaques, emphysema, and 4 mm nodule in Rt lung.  He was treated with doxycycline.  He was seen by Dr. Ander Slade in pulmonary office.  He had left thoracentesis done with IR on 08/04/21 with 550 ml fluid removed.  Fluid analysis consistent with an exudate (LDH 511, protein 5.1, glucose 108, WBC 2340 with 82% lymphocytes, gram stain negative).  He had progressive chest pain and dyspnea after procedure and presented to the ER and was admitted to hospital.  CT chest/abd/pelvis from 08/05/21 showed: "Consolidation of medial LLL with area of cavitation.  Adjacent to and inseparable from the medial left lower lobe consolidation is a 6.0 x 4.3 x 9.6 cm area low-density mass with thickened, irregular enhancing margins, abutting and inseparable from the descending thoracic aorta. This abnormal soft tissue contacts the distal esophagus as well and extends through the diaphragmatic hiatus to abut the celiac axis and left renal vein.  1.1 cm necrotic nodule posterior Rt kidney."   Pertinent  Medical History   CAD S/P PCI with DAPT started 02/2021, GERD, HLD, anxiety, and arthritis, Pectus excavatum  Significant Hospital Events: Including procedures, antibiotic start and stop dates in addition to other pertinent events   5/16 Admitted with chest pain CTA concerning for new lung mass  5/17 PCCM consulted, plavix held 5/18 Ongoing chest/mid chest retrosternal, left lower chest discomfort   Interim History / Subjective:  Afebrile  Pt reports ongoing  chest pain. States the morphine is not really helping, hoping the oxycodone will help (just started this today per his report)  Objective   Blood pressure 131/71, pulse 84, temperature 99.7 F (37.6 C), temperature source Oral, resp. rate 16, height 5\' 7"  (1.702 m), weight 48.6 kg, SpO2 93 %.        Intake/Output Summary (Last 24 hours) at 08/11/2021 0820 Last data filed at 08/10/2021 1800 Gross per 24 hour  Intake 1055.5 ml  Output --  Net 1055.5 ml   Filed Weights   08/05/21 0901 08/05/21 1827  Weight: 49.8 kg 48.6 kg    Examination: General: cachectic adult male lying in bed in NAD  HEENT: MM pink/moist, anicteric, wearing glasses  Neuro: AAOx4, speech clear, MAE  CV: s1s2 RRR, no m/r/g PULM: non-labored at rest, lungs bilaterally clear GI: soft, bsx4 active  Extremities: warm/dry, no edema  Skin: no rashes or lesions  Assessment & Plan:   Lt lower lung mass/consolidation with area of cavitation Rt lung nodule. - Plan for bronchoscopy/EBUS with TBNA today followed by EGD with GI for evaluation of the esophagus and possible esophageal stent placement. - Has been NPO -procedure discussed with patient -continue hold plavix  -continue abx rocephin 6/7 + azithro 6/6  Lt exudate pleural effusion. Negative pleural cytology from 5/15 / non-diagnostic  -follow intermittent CXR  Centrilobular emphysema. -continue incruse, breo  Coronary artery disease s/p stent in December 2022, HLD. -hold plavix  -continue ASA   Severe malnutrition with odynophagia. 1.1 cm necrotic nodule posterior Rt kidney. -Per primary / TRH  Wife updated on plan of care 5/19 via phone - reviewed plan for coordination of GI/Pulm procedures for patient to limit anesthesia.    Labs :      Latest Ref Rng & Units 08/09/2021    8:19 AM 08/08/2021   11:49 AM 08/08/2021    4:12 AM  CMP  Glucose 70 - 99 mg/dL 107   124   127    BUN 8 - 23 mg/dL 24   23   22     Creatinine 0.61 - 1.24 mg/dL 0.75    0.86   0.95    Sodium 135 - 145 mmol/L 139   139   135    Potassium 3.5 - 5.1 mmol/L 4.4   3.4   3.4    Chloride 98 - 111 mmol/L 101   98   97    CO2 22 - 32 mmol/L 30   29   28     Calcium 8.9 - 10.3 mg/dL 9.3   9.5   9.0         Latest Ref Rng & Units 08/09/2021    8:19 AM 08/08/2021   11:49 AM 08/08/2021    4:12 AM  CBC  WBC 4.0 - 10.5 K/uL 14.6   15.5   14.6    Hemoglobin 13.0 - 17.0 g/dL 13.3   13.8   13.4    Hematocrit 39.0 - 52.0 % 41.1   40.9   40.7    Platelets 150 - 400 K/uL 281   273   281      Signature:   Freda Jackson, MD [facility] Office: [phone]   See Amion for personal pager PCCM on call pager [phone] until 7pm. Please call Elink 7p-7a. [phone]

## 2021-08-11 NOTE — Anesthesia Procedure Notes (Signed)
Procedure Name: Intubation Date/Time: 08/11/2021 12:40 PM Performed by: Claudia Desanctis, CRNA Pre-anesthesia Checklist: Patient identified, Emergency Drugs available, Suction available and Patient being monitored Patient Re-evaluated:Patient Re-evaluated prior to induction Oxygen Delivery Method: Circle system utilized Preoxygenation: Pre-oxygenation with 100% oxygen Induction Type: IV induction Ventilation: Mask ventilation without difficulty Laryngoscope Size: 2 and Miller Grade View: Grade I Tube type: Oral Tube size: 9.0 mm Number of attempts: 1 Airway Equipment and Method: Stylet Placement Confirmation: ETT inserted through vocal cords under direct vision, positive ETCO2 and breath sounds checked- equal and bilateral Secured at: 22 cm Tube secured with: Tape Dental Injury: Teeth and Oropharynx as per pre-operative assessment

## 2021-08-11 NOTE — Progress Notes (Signed)
Alex [name] 11:53 AM  Subjective: Patient seen and examined in hospital computer chart reviewed and his case discussed with his wife as well and case discussed with my partner Dr. Paulita Fujita and he is lost a significant amount of weight and is swallowing has gotten progressively worse over the last month and prior to this he has not had any GI issues and has no other complaints Plavix on hold  Objective: Vital signs stable afebrile no acute distress exam please see preassessment evaluation labs stable CT reviewed  Assessment: Dysphagia questionable etiology abnormal CT of the lung and mediastinum  Plan: The risk benefits methods of endoscopy stenting dilation and doubtful Botox injection were all thoroughly discussed with the patient and his wife and will proceed after his bronchoscopy to better understand his anatomy and decide if any thing further needs to be done at this time versus wait on therapy and see if swallowing does not improve and we also talked about stent migration PEG placement etc. and answered all of their questions  [facility]  office [phone] After 5PM or if no answer call [phone]

## 2021-08-11 NOTE — TOC Progression Note (Signed)
Transition of Care [facility]) - Progression Note    Patient Details  Name: [name] MRN: [id number] Date of Birth: 1950/04/10  Transition of Care [facility]) CM/SW Contact  Leeroy Cha, RN Phone Number: 08/11/2021, 9:37 AM  Clinical Narrative:    Following for toc needs   Expected Discharge Plan: Home/Self Care Barriers to Discharge: No Barriers Identified  Expected Discharge Plan and Services Expected Discharge Plan: Home/Self Care     Post Acute Care Choice: Durable Medical Equipment, Home Health Living arrangements for the past 2 months: Single Family Home                                       Social Determinants of Health (SDOH) Interventions    Readmission Risk Interventions     View : No data to display.

## 2021-08-11 NOTE — Transfer of Care (Signed)
Immediate Anesthesia Transfer of Care Note  Patient: Alex [name]  Procedure(s) Performed: ENDOBRONCHIAL ULTRASOUND (Left) BRONCHIAL NEEDLE ASPIRATION BIOPSIES VIDEO BRONCHOSCOPY WITHOUT FLUORO ESOPHAGOGASTRODUODENOSCOPY (EGD) WITH PROPOFOL BALLOON DILATION  Patient Location: PACU  Anesthesia Type:General  Level of Consciousness: awake, alert , oriented and patient cooperative  Airway & Oxygen Therapy: Patient Spontanous Breathing and Patient connected to face mask oxygen  Post-op Assessment: Report given to RN and Post -op Vital signs reviewed and stable  Post vital signs: Reviewed and stable  Last Vitals:  Vitals Value Taken Time  BP 141/70 08/11/21 1434  Temp    Pulse 76 08/11/21 1434  Resp 12 08/11/21 1434  SpO2 100 % 08/11/21 1434    Last Pain:  Vitals:   08/11/21 1434  TempSrc:   PainSc: Asleep      Patients Stated Pain Goal: 3 (73/57/89 7847)  Complications: No notable events documented.

## 2021-08-11 NOTE — Progress Notes (Signed)
PROGRESS NOTE    Alex [name]  OHY:[id number] DOB: 14-May-1950 DOA: 08/05/2021 PCP: Alroy Dust, L.Marlou Sa, MD   Brief Narrative: 71 year old with past medical history significant for CAD status post stent in December 2022 on dual antiplatelet agent, presented with chest pain for some time now and had work-up done by cardiologist including a PET scan which showed pleural effusion and consolidation.  Underwent thoracentesis 2 days prior to admission and he presented with worsening chest pain. He report poor oral intake.  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  Patient was admitted for further work-up.    Assessment & Plan:   Principal Problem:   Mass in chest Active Problems:   Coronary artery disease of native artery of native heart with stable angina pectoris (HCC)   Mediastinal mass   Protein-calorie malnutrition, severe   Oral candidiasis   1-Lung mass/// superimpose PNA  Left Pleural Effusion. S/P thoracentesis 5/15 -CT angio chest: Heterogeneously enhancing consolidation in the medial left lower lobe with new small area of cavitation.  Adjacent to an inseparable from consolidation is a 6 x 4 x 9.6 cm low-density mass with thickened and irregular enhancing margins, abutting  and inseparable from the descending thoracic aorta and distal esophagus.  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 -Pulmonologist Consulted.  -Patient will need bronchoscopy, Plavix discontinue.  -Continue with Ceftriaxone and Azithro  -Follow Cytology :  Pleural fluid non diagnostic.  -Discussed with Oncology, we need to contact them when Diagnosis is established,.  -Plan for Bronchoscopy on 5/22. In conjunction with endoscopy.  -Continue with  oral oxycodone. He will likely required oxycodone at discharge.  -For bronchoscopy/endoscopy today.   2-Necrotic  tumor extending into the upper abdomen, 1.3 cm aortocaval necrotic lymph node versus tumor extension.  1.1 cm necrotic nodule posterior to the right kidney. -He will need oncology consultation.   3-CAD status post stent December 2022: Underwent antiplatelet therapy Holding Plavix for bronchoscopy. Discussed with Dr. Irish Lack, ok to hold plavix prior to bronchoscopy.  Continue with Cardizem and statins  Dysphagia; Severe Malnutrition: suspect related to lung mass pressing esophagus./  Undergoing evaluation. Probably mass could be pressing the esophagus.  Started Ensure.  Unable to tolerates diet.  GI consulted, plan for endoscopy and possible esophageal stent placement on Monday.   Constipation; Continue with  senna, miralax. He had BM 5/18. Continue with laxatives.   Hypokalemia; replete IV>  Oral Candidiasis.  Continue with  Nystatin/ fluconazole.    Estimated body mass index is 16.78 kg/m as calculated from the following:   Height as of this encounter: 5\' 7"  (1.702 m).   Weight as of this encounter: 48.6 kg.   DVT prophylaxis: SCD Code Status: Full code Family Communication: Care discussed with wife 5/20 Disposition Plan:  Status is: Inpatient Remains inpatient appropriate because: admitted with lung mass. Chest pain     Consultants:  Pulmonologist   Procedures:  CTA  Antimicrobials:    Subjective: Pain is tolerable with oxycodone.  Awaiting for procedure.    Objective: Vitals:   08/10/21 2012 08/11/21 0350 08/11/21 0818 08/11/21 1130  BP: 117/65 131/71  (!) 151/85  Pulse: 80 84  84  Resp: 16 16  20   Temp: 98.2 F (36.8 C) 99.7 F (37.6 C)  98.4 F (36.9 C)  TempSrc: Oral Oral  Tympanic  SpO2: 97% 93% 94% 94%  Weight:    48.6 kg  Height:    5\' 7"  (1.702 m)    Intake/Output Summary (Last 24 hours) at 08/11/2021 1230 Last data filed at 08/11/2021 0830 Gross per 24 hour  Intake 630.3 ml  Output --  Net 630.3 ml    Filed Weights   08/05/21 0901  08/05/21 1827 08/11/21 1130  Weight: 49.8 kg 48.6 kg 48.6 kg    Examination:  General exam: NAD Respiratory system: CTA Cardiovascular system: S 1, S 2 RRR Gastrointestinal system: BS present, soft, nt Central nervous system: Alert, follows command Extremities: No edema    Data Reviewed: I have personally reviewed following labs and imaging studies  CBC: Recent Labs  Lab 08/05/21 0929 08/06/21 0402 08/07/21 0438 08/08/21 0412 08/08/21 1149 08/09/21 0819 08/11/21 0841  WBC 13.1*   < > 13.3* 14.6* 15.5* 14.6* 14.8*  NEUTROABS 10.3*  --   --   --   --   --   --   HGB 13.7   < > 12.9* 13.4 13.8 13.3 13.5  HCT 41.3   < > 37.2* 40.7 40.9 41.1 40.6  MCV 88.1   < > 88.4 90.0 88.7 90.3 89.0  PLT 278   < > 252 281 273 281 287   < > = values in this interval not displayed.    Basic Metabolic Panel: Recent Labs  Lab 08/07/21 0438 08/08/21 0412 08/08/21 1149 08/09/21 0819 08/11/21 0841  NA 138 135 139 139 136  K 3.4* 3.4* 3.4* 4.4 3.3*  CL 99 97* 98 101 99  CO2 30 28 29 30 28   GLUCOSE 118* 127* 124* 107* 106*  BUN 22 22 23  24* 14  CREATININE 0.68 0.95 0.86 0.75 0.74  CALCIUM 9.1 9.0 9.5 9.3 8.8*    GFR: Estimated Creatinine Clearance: 57.4 mL/min (by C-G formula based on SCr of 0.74 mg/dL). Liver Function Tests: Recent Labs  Lab 08/05/21 0929 08/06/21 0402 08/11/21 0841  AST 20 25 22   ALT 19 24 22   ALKPHOS 104 91 83  BILITOT 0.9 1.0 0.9  PROT 7.7 6.9 6.6  ALBUMIN 4.1 3.6 3.1*    Recent Labs  Lab 08/05/21 0929  LIPASE 18    No results for input(s): AMMONIA in the last 168 hours. Coagulation Profile: Recent Labs  Lab 08/11/21 0841  INR 1.3*   Cardiac Enzymes: No results for input(s): CKTOTAL, CKMB, CKMBINDEX, TROPONINI in the last 168 hours. BNP (last 3 results) No results for input(s): PROBNP in the last 8760 hours. HbA1C: No results for input(s): HGBA1C in the last 72 hours. CBG: No results for input(s): GLUCAP in the last 168 hours. Lipid  Profile: No results for input(s): CHOL, HDL, LDLCALC, TRIG, CHOLHDL, LDLDIRECT in the last 72 hours. Thyroid Function Tests: No results for input(s): TSH, T4TOTAL, FREET4, T3FREE, THYROIDAB in the last 72 hours. Anemia Panel: No results for input(s): VITAMINB12, FOLATE, FERRITIN, TIBC, IRON, RETICCTPCT in the last 72 hours.  Sepsis Labs: No results for input(s): PROCALCITON, LATICACIDVEN in the last 168 hours.  Recent Results (from the past 240 hour(s))  Gram stain     Status: None   Collection Time: 08/04/21 10:33 AM   Specimen: PATH Cytology Pleural fluid  Result Value Ref Range Status   Specimen Description PLEURAL FLUID  Final   Special Requests NONE  Final   Gram Stain   Final    NO WBC SEEN NO ORGANISMS SEEN Performed at [facility], [address], [place], [place] [address]    Report Status 08/04/2021  FINAL  Final  Fungus Culture With Stain     Status: None (Preliminary result)   Collection Time: 08/04/21 10:33 AM   Specimen: PATH Cytology Pleural fluid  Result Value Ref Range Status   Fungus Stain Final report  Final    Comment: (NOTE) Performed At: [facility] [place], [place] [id number] Rush Farmer MD [id number]    Fungus (Mycology) Culture PENDING  Incomplete   Fungal Source PLEURAL  Final    Comment: FLUID Performed at [facility], [place] [address] [place], [place] [address]   Acid Fast Smear (AFB)     Status: None   Collection Time: 08/04/21 10:33 AM   Specimen: PATH Cytology Pleural fluid  Result Value Ref Range Status   AFB Specimen Processing Concentration  Final   Acid Fast Smear Negative  Final    Comment: (NOTE) Performed At: [facility] [place], [place] [id number] Rush Farmer MD [id number]    Source (AFB) PLEURAL  Final    Comment: FLUID Performed at [facility], [place] [address] [place], [place] [address]   Fungus Culture Result     Status: None   Collection Time:  08/04/21 10:33 AM  Result Value Ref Range Status   Result 1 Comment  Final    Comment: (NOTE) KOH/Calcofluor preparation:  no fungus observed. Performed At: [facility] [place], [place] [id number] Rush Farmer MD [id number]   Blood culture (routine x 2)     Status: None   Collection Time: 08/05/21  9:28 AM   Specimen: Right Antecubital; Blood  Result Value Ref Range Status   Specimen Description   Final    RIGHT ANTECUBITAL Performed at [facility], [address], [place], [place] [address]    Special Requests   Final    BOTTLES DRAWN AEROBIC AND ANAEROBIC Blood Culture results may not be optimal due to an inadequate volume of blood received in culture bottles Performed at [facility], [address] [place], [place] [address]    Culture   Final    NO GROWTH 5 DAYS Performed at [facility], [place] [address], [place], [place] [address]    Report Status 08/10/2021 FINAL  Final  Blood culture (routine x 2)     Status: None   Collection Time: 08/05/21  9:29 AM   Specimen: BLOOD LEFT FOREARM  Result Value Ref Range Status   Specimen Description   Final    BLOOD LEFT FOREARM Performed at [facility], [address], [place], [place] [address]    Special Requests   Final    BOTTLES DRAWN AEROBIC AND ANAEROBIC Blood Culture adequate volume Performed at [facility], [address], [place], [place] [address]    Culture   Final    NO GROWTH 5 DAYS Performed at [facility], [place] [address], [place], [place] [address]    Report Status 08/10/2021 FINAL  Final          Radiology Studies: No results found.      Scheduled Meds:  [MAR Hold] atorvastatin  80 mg Oral Daily   [MAR Hold] diltiazem  120 mg Oral Daily   [MAR Hold] feeding supplement  1 Container Oral TID BM   [MAR Hold] feeding supplement  237 mL Oral BID BM   [MAR Hold] fluticasone  furoate-vilanterol  1 puff Inhalation Daily   And   [MAR Hold] umeclidinium bromide  1 puff Inhalation Daily   [MAR Hold] mouth rinse  15 mL Mouth Rinse BID   [MAR Hold] multivitamin with minerals  1 tablet Oral Daily   [MAR Hold] nystatin  5 mL Oral QID   [MAR Hold] pantoprazole  40 mg Oral Daily   [MAR Hold] polyethylene glycol  17 g Oral BID   [MAR Hold] senna-docusate  1 tablet Oral BID   [MAR Hold] sodium phosphate  1 enema Rectal Once   [MAR Hold] Vitamin D (Ergocalciferol)  50,000 Units Oral Q7 days   Continuous Infusions:  [MAR Hold] cefTRIAXone (ROCEPHIN)  IV 2 g (08/11/21 1046)   [MAR Hold] fluconazole (DIFLUCAN) IV 100 mg (08/10/21 1534)   lactated ringers 75 mL/hr at 08/10/21 2150     LOS: 6 days    Time spent: 35 minutes.     Elmarie Shiley, MD [facility]   If 7PM-7AM, please contact night-coverage [email or web address]  08/11/2021, 12:30 PM

## 2021-08-11 NOTE — Progress Notes (Signed)
PT Cancellation Note  Patient Details Name: STANISLAW ACTON MRN: [id number] DOB: June 27, 1950   Cancelled Treatment:    Reason Eval/Treat Not Completed: Patient declined, no reason specified (pt reports he is hurting and tired today. requests therapy come back at later time today or tomorrow. Will follow up as schedule allows.)   Gwynneth Albright PT, DPT Acute Rehabilitation Services Office [phone] Pager [phone]  08/11/21 10:48 AM

## 2021-08-11 NOTE — Op Note (Signed)
Flexible and EBUS Bronchoscopy Procedure Note  Alex [name]  [id number]  1950/06/08  Date:08/11/21  Time:2:06 PM   Provider Performing:Ryeleigh Santore B Shifa Brisbon   Procedure: Flexible bronchoscopy and EBUS Bronchoscopy  Indication(s) Lung Mass Mediastinal Adenopathy  Consent Risks of the procedure as well as the alternatives and risks of each were explained to the patient and/or caregiver.  Consent for the procedure was obtained.  Anesthesia General Anesthesia   Time Out Verified patient identification, verified procedure, site/side was marked, verified correct patient position, special equipment/implants available, medications/allergies/relevant history reviewed, required imaging and test results available.   Sterile Technique Usual hand hygiene, masks, gowns, and gloves were used   Procedure Description Diagnostic bronchoscope advanced through endotracheal tube and into airway.  Airways were examined down to subsegmental level with findings noted below.  Following diagnostic evaluation the diagnostic bronchoscope was then removed and the EBUS bronchoscope was advanced into airway with station 7 and mediastinal mass biopsied and sent for slide, cell block, and/or culture.  The EBUS bronchoscope was removed after assuring no active bleeding from biopsy site.  Findings:  - Normal appearing airways - Large station 7 LN/mass - Multiple passes performed at station 7 LN/mass from left and right side   Complications/Tolerance None; patient tolerated the procedure well. Chest X-ray is not needed post procedure.   EBL Minimal   Specimen(s) Tissue samples sent in saline for AFB/Fungal cultures Tissue samples sent via slide and cell block to pathology/cytology

## 2021-08-11 NOTE — Anesthesia Postprocedure Evaluation (Signed)
Anesthesia Post Note  Patient: Alex [name]  Procedure(s) Performed: ENDOBRONCHIAL ULTRASOUND (Left) BRONCHIAL NEEDLE ASPIRATION BIOPSIES VIDEO BRONCHOSCOPY WITHOUT FLUORO ESOPHAGOGASTRODUODENOSCOPY (EGD) WITH PROPOFOL BALLOON DILATION     Patient location during evaluation: Endoscopy Anesthesia Type: General Level of consciousness: awake and alert Pain management: pain level controlled Vital Signs Assessment: post-procedure vital signs reviewed and stable Respiratory status: spontaneous breathing, nonlabored ventilation and respiratory function stable Cardiovascular status: blood pressure returned to baseline and stable Postop Assessment: no apparent nausea or vomiting Anesthetic complications: no   No notable events documented.  Last Vitals:  Vitals:   08/11/21 1520 08/11/21 1530  BP: (!) 143/80 (!) 145/74  Pulse: 83 88  Resp: 18 20  Temp:    SpO2: 94% 94%    Last Pain:  Vitals:   08/11/21 1530  TempSrc:   PainSc: 0-No pain                 Lidia Collum

## 2021-08-11 NOTE — Anesthesia Preprocedure Evaluation (Signed)
Anesthesia Evaluation  Patient identified by MRN, date of birth, ID band Patient awake    Reviewed: Allergy & Precautions, NPO status , Patient's Chart, lab work & pertinent test results  History of Anesthesia Complications Negative for: history of anesthetic complications  Airway Mallampati: I  TM Distance: >3 FB Neck ROM: Full    Dental  (+) Edentulous Upper, Missing, Dental Advisory Given   Pulmonary COPD, former smoker,  left pleural effusion s/p thoracentesis, left lung mass abutting the descending thoracic aorta and distal esophagus   Pulmonary exam normal        Cardiovascular + CAD, + Cardiac Stents (02/2021) and + Peripheral Vascular Disease  Normal cardiovascular exam     Neuro/Psych Carotid stenosis    GI/Hepatic Neg liver ROS, GERD  ,  Endo/Other  negative endocrine ROS  Renal/GU negative Renal ROS  negative genitourinary   Musculoskeletal negative musculoskeletal ROS (+)   Abdominal   Peds  Hematology negative hematology ROS (+)   Anesthesia Other Findings   Reproductive/Obstetrics                             Anesthesia Physical Anesthesia Plan  ASA: 4  Anesthesia Plan: General   Post-op Pain Management: Minimal or no pain anticipated   Induction: Intravenous  PONV Risk Score and Plan: 2 and Ondansetron, Dexamethasone, Treatment may vary due to age or medical condition and Midazolam  Airway Management Planned: Oral ETT  Additional Equipment: None  Intra-op Plan:   Post-operative Plan: Extubation in OR  Informed Consent: I have reviewed the patients History and Physical, chart, labs and discussed the procedure including the risks, benefits and alternatives for the proposed anesthesia with the patient or authorized representative who has indicated his/her understanding and acceptance.     Dental advisory given  Plan Discussed with:   Anesthesia Plan  Comments:         Anesthesia Quick Evaluation

## 2021-08-11 NOTE — Op Note (Signed)
[facility] Patient Name: Alex [name] Procedure Date: 08/11/2021 MRN: [id number] Attending MD: Clarene Essex , MD Date of Birth: August 21, 1950 CSN: [id number] Age: 71 Admit Type: Inpatient Procedure:                Upper GI endoscopy Indications:              Oral phase dysphagia, Dysphagia Providers:                Clarene Essex, MD Referring MD:              Medicines:                General Anesthesia Complications:            No immediate complications. Estimated Blood Loss:     Estimated blood loss: none. Procedure:                After obtaining informed consent, the endoscope was                            passed under direct vision. Throughout the                            procedure, the patient's blood pressure, pulse, and                            oxygen saturations were monitored continuously.The                            upper GI endoscopy was accomplished without                            difficulty. The patient tolerated the procedure                            well. The GIF-H190 ([id number]) Olympus endoscope was                            introduced through the mouth, and advanced to the                            second part of duodenum. After obtaining informed                            consent, the endoscope was passed under direct                            vision. Throughout the procedure, the patient's                            blood pressure, pulse, and oxygen saturations were                            monitored continuously. Scope In: Scope Out: Findings:      No appreciable esophageal motility was noted. In addition, a hypertonic       lower esophageal sphincter was found. There was mild  resistance to       endoscope advancement into the stomach. The Z-line was regular. The       gastroesophageal junction was normal on retroflexed view. A TTS dilator       was passed through the scope. Dilation with a 15-16.5-18 mm balloon       dilator was  performed to 18 mm. The dilation site was examined and       showed mild mucosal disruption.      A small hiatal hernia was present.      The entire examined stomach was normal.      The duodenal bulb, first portion of the duodenum and second portion of       the duodenum were normal.      The exam was otherwise without abnormality. Impression:               - The esophageal examination was consistent with                            achalasia. Dilated.                           - Small hiatal hernia.                           - Normal stomach.                           - Normal duodenal bulb, first portion of the                            duodenum and second portion of the duodenum.                           - The examination was otherwise normal.                           - No specimens collected. Moderate Sedation:      Not Applicable - Patient had care per Anesthesia. Recommendation:           - Clear liquid diet today.                           - Continue present medications.                           - Return to GI clinic PRN.                           - Telephone GI clinic if symptomatic PRN. Procedure Code(s):        --- Professional ---                           [phone], Esophagogastroduodenoscopy, flexible,                            transoral; with transendoscopic balloon dilation of  esophagus (less than 30 mm diameter) Diagnosis Code(s):        --- Professional ---                           K44.9, Diaphragmatic hernia without obstruction or                            gangrene                           R13.11, Dysphagia, oral phase CPT copyright 2019 [employer]. All rights reserved. The codes documented in this report are preliminary and upon coder review may  be revised to meet current compliance requirements. Clarene Essex, MD 08/11/2021 3:26:03 PM This report has been signed electronically. Number of Addenda: 0

## 2021-08-12 ENCOUNTER — Encounter (HOSPITAL_COMMUNITY): Payer: Self-pay | Admitting: Pulmonary Disease

## 2021-08-12 ENCOUNTER — Inpatient Hospital Stay (HOSPITAL_COMMUNITY): Payer: Medicare Other

## 2021-08-12 DIAGNOSIS — R222 Localized swelling, mass and lump, trunk: Secondary | ICD-10-CM | POA: Diagnosis not present

## 2021-08-12 LAB — CBC
HCT: 37.1 % — ABNORMAL LOW (ref 39.0–52.0)
Hemoglobin: 12.1 g/dL — ABNORMAL LOW (ref 13.0–17.0)
MCH: 29.1 pg (ref 26.0–34.0)
MCHC: 32.6 g/dL (ref 30.0–36.0)
MCV: 89.2 fL (ref 80.0–100.0)
Platelets: 250 10*3/uL (ref 150–400)
RBC: 4.16 MIL/uL — ABNORMAL LOW (ref 4.22–5.81)
RDW: 12.5 % (ref 11.5–15.5)
WBC: 12.2 10*3/uL — ABNORMAL HIGH (ref 4.0–10.5)
nRBC: 0 % (ref 0.0–0.2)

## 2021-08-12 LAB — BASIC METABOLIC PANEL
Anion gap: 6 (ref 5–15)
BUN: 16 mg/dL (ref 8–23)
CO2: 28 mmol/L (ref 22–32)
Calcium: 8.6 mg/dL — ABNORMAL LOW (ref 8.9–10.3)
Chloride: 102 mmol/L (ref 98–111)
Creatinine, Ser: 0.7 mg/dL (ref 0.61–1.24)
GFR, Estimated: >60 mL/min (ref >60)
Glucose, Bld: 141 mg/dL — ABNORMAL HIGH (ref 70–99)
Potassium: 4.5 mmol/L (ref 3.5–5.1)
Sodium: 136 mmol/L (ref 135–145)

## 2021-08-12 MED ORDER — GADOBUTROL 1 MMOL/ML IV SOLN
5.0000 mL | Freq: Once | INTRAVENOUS | Status: AC | PRN
  Administered 2021-08-12: 5 mL via INTRAVENOUS

## 2021-08-12 MED ORDER — CLOPIDOGREL BISULFATE 75 MG PO TABS
75.0000 mg | ORAL_TABLET | Freq: Every day | ORAL | Status: DC
  Administered 2021-08-12 – 2021-08-15 (×4): 75 mg via ORAL
  Filled 2021-08-12 (×4): qty 1

## 2021-08-12 MED ORDER — CLONAZEPAM 0.5 MG PO TABS
0.5000 mg | ORAL_TABLET | Freq: Three times a day (TID) | ORAL | Status: DC | PRN
  Administered 2021-08-12 – 2021-08-15 (×4): 0.5 mg via ORAL
  Filled 2021-08-12 (×4): qty 1

## 2021-08-12 NOTE — Progress Notes (Signed)
Physical Therapy Treatment Patient Details Name: Alex [name] MRN: [id number] DOB: Mar 15, 1951 Today's Date: 08/12/2021   History of Present Illness Patient is 71 y.o. male admitted with chest pain CTA concerning for new lung mass. He is a former smoker had PCI with DAPT in December 2022.  He had persistent weight loss, dysphagia, and chest pain.  He had cardiac PET scan on 07/23/21.  This showed Lt lower lobe airspace disease and Lt pleural effusion, pleural plaques, emphysema, and 4 mm nodule in Rt lung.  He was treated with doxycycline.  Lt thoracentesis done with IR on 08/04/21 with 550 ml fluid removed.  He had progressive chest pain and dyspnea after procedure and presented to the ER and was admitted to hospital.  CT chest/abd/pelvis from 08/05/21 showed: "Consolidation of medial LLL with area of cavitation.  Adjacent to and inseparable from the medial left lower lobe consolidation is a 6.0 x 4.3 x 9.6 cm area low-density mass with thickened, irregular enhancing margins, abutting and inseparable from the descending thoracic aorta. This abnormal soft tissue contacts the distal esophagus as well and extends through the diaphragmatic hiatus to abut the celiac axis and left renal vein. Rt kidney nodule notes as well. PMH significant for  CAD S/P PCI with DAPT started 02/2021, GERD, HLD, anxiety, and arthritis, Pectus excavatum.    PT Comments    Pt AxO x 3 very pleasant and willing.  Limited activity.  General transfer comment: guarding for safety with rise. pt with flexed posture andunable to fully stand upright. Pt required rest break after first stand. then stood to attempt ambulation.  Then required a rest break after using bathroom.  HR 88. General Gait Details: HHA to steady balance as pt drfiting Rt/Lt and posture flexed. "furniture walking".  Increased LUQ ABD pain after. Assisted back to bed per pt request due to increased pain and fatigue.  Not able to tolerate amb in hallway after using BR.     Recommendations for follow up therapy are one component of a multi-disciplinary discharge planning process, led by the attending physician.  Recommendations may be updated based on patient status, additional functional criteria and insurance authorization.  Follow Up Recommendations  Home health PT     Assistance Recommended at Discharge Intermittent Supervision/Assistance  Patient can return home with the following A little help with walking and/or transfers;A little help with bathing/dressing/bathroom;Assistance with cooking/housework;Direct supervision/assist for medications management;Assist for transportation;Help with stairs or ramp for entrance   Equipment Recommendations  None recommended by PT    Recommendations for Other Services       Precautions / Restrictions Precautions Precautions: Fall Restrictions Weight Bearing Restrictions: No     Mobility  Bed Mobility Overal bed mobility: Needs Assistance Bed Mobility: Supine to Sit, Sit to Supine     Supine to sit: Supervision Sit to supine: Supervision   General bed mobility comments: self able, supervision for safety, pt taking extra time    Transfers Overall transfer level: Needs assistance Equipment used: None Transfers: Sit to/from Stand Sit to Stand: Min guard, Min assist           General transfer comment: guarding for safety with rise. pt with flexed posture andunable to fully stand upright. Pt required rest break after first stand. then stood to attempt ambulation.  Then required a rest break after using bathroom.  HR 88.    Ambulation/Gait Ambulation/Gait assistance: Min assist, Min guard Gait Distance (Feet): 14 Feet Assistive device: 1 person hand held assist  Gait Pattern/deviations: Step-through pattern, Decreased stride length, Shuffle, Drifts right/left, Trunk flexed Gait velocity: decreased     General Gait Details: HHA to steady balance as pt drfiting Rt/Lt and posture flexed. "furniture  walking".  Increased LUQ ABD pain after.   Stairs             Wheelchair Mobility    Modified Rankin (Stroke Patients Only)       Balance                                            Cognition Arousal/Alertness: Awake/alert Behavior During Therapy: WFL for tasks assessed/performed Overall Cognitive Status: Within Functional Limits for tasks assessed                                 General Comments: AxO x 3 very pleasant        Exercises      General Comments        Pertinent Vitals/Pain Pain Assessment Pain Assessment: Faces Faces Pain Scale: Hurts even more Pain Location: ABD LUQ increased with activity Pain Descriptors / Indicators: Aching, Discomfort, Sore, Guarding Pain Intervention(s): Monitored during session    Home Living                          Prior Function            PT Goals (current goals can now be found in the care plan section) Progress towards PT goals: Progressing toward goals    Frequency    Min 3X/week      PT Plan Current plan remains appropriate    Co-evaluation              AM-PAC PT "6 Clicks" Mobility   Outcome Measure  Help needed turning from your back to your side while in a flat bed without using bedrails?: A Little Help needed moving from lying on your back to sitting on the side of a flat bed without using bedrails?: A Little Help needed moving to and from a bed to a chair (including a wheelchair)?: A Little Help needed standing up from a chair using your arms (e.g., wheelchair or bedside chair)?: A Little Help needed to walk in hospital room?: A Little Help needed climbing 3-5 steps with a railing? : A Lot 6 Click Score: 17    End of Session Equipment Utilized During Treatment: Gait belt Activity Tolerance: Patient tolerated treatment well;Patient limited by pain Patient left: in bed;with bed alarm set;with call bell/phone within reach Nurse  Communication: Mobility status PT Visit Diagnosis: Muscle weakness (generalized) (M62.81);Difficulty in walking, not elsewhere classified (R26.2);Unsteadiness on feet (R26.81);Other symptoms and signs involving the nervous system (R29.898)     Time: 7619-5093 PT Time Calculation (min) (ACUTE ONLY): 24 min  Charges:  $Gait Training: 8-22 mins $Therapeutic Activity: 8-22 mins                      Rica Koyanagi  PTA Acute  Rehabilitation Services Pager      [phone] Office      [phone]

## 2021-08-12 NOTE — Progress Notes (Signed)
PROGRESS NOTE    Alex [name]  RCV:[id number] DOB: Oct 08, 1950 DOA: 08/05/2021 PCP: Alroy Dust, L.Marlou Sa, MD   Brief Narrative: 71 year old with past medical history significant for CAD status post stent in December 2022 on dual antiplatelet agent, presented with chest pain for some time now and had work-up done by cardiologist including a PET scan which showed pleural effusion and consolidation.  Underwent thoracentesis 2 days prior to admission and he presented with worsening chest pain. He report poor oral intake.  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  Patient was admitted for further work-up.  Patient underwent endoscopy and bronchoscopy on 5/22.  Preliminary report from bronchoscopy consistent with non-small cell cancer.  Endoscopy consistent with achalasia, s/p dilation.  Oncology has been consulted.  Patient was a started on liquid diet advance as tolerated.  Assessment & Plan:   Principal Problem:   Mass in chest Active Problems:   Coronary artery disease of native artery of native heart with stable angina pectoris (HCC)   Mediastinal mass   Protein-calorie malnutrition, severe   Oral candidiasis   1-Lung mass/// superimpose PNA , Preliminary report Non small Lung cancer.  Left Pleural Effusion. S/P thoracentesis 5/15 -CT angio chest: Heterogeneously enhancing consolidation in the medial left lower lobe with new small area of cavitation.  Adjacent to an inseparable from consolidation is a 6 x 4 x 9.6 cm low-density mass with thickened and irregular enhancing margins, abutting  and inseparable from the descending thoracic aorta and distal esophagus.  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 -Pulmonologist Consulted.  -Completed Ceftriaxone and Azithro treatment.  -Cytology Pleural fluid non diagnostic.  -Underwent   Bronchoscopy on 5/22. In conjunction with endoscopy.  -Continue with  oral oxycodone. He will likely required oxycodone at discharge.  -Preliminary report consistent with non small cell lung cancer. Dr Lorenso Courier consulted.  -He has been more anxious , change klonopin to TID PRN.   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  1.1 cm necrotic nodule posterior to the right kidney. -Oncology consulted.   3-CAD status post stent December 2022: Underwent antiplatelet therapy Plavix held  for bronchoscopy. Discussed with Dr. Irish Lack, ok to hold plavix prior to bronchoscopy.  Continue with Cardizem and statins Ok to resume plavix today per pulmonologist.   Dysphagia;  Severe Malnutrition Continue with  Ensure.  GI consulted, Underwent endoscopy, finding consistent with Achalasia. Underwent dilation.  Started on clear liquid diet.  Continue with Ensure.   Constipation; Continue with  senna, miralax. He had BM 5/18. Continue with laxatives.   Hypokalemia; Resolved>  Oral Candidiasis.  Continue with  Nystatin/  Discontinue fluconazole, no candida esophagitis on endoscopy.    Estimated body mass index is 16.78 kg/m as calculated from the following:   Height as of this encounter: 5\' 7"  (1.702 m).   Weight as of this encounter: 48.6 kg.   DVT prophylaxis: SCD Code Status: Full code Family Communication: Care discussed with wife 5/23 Disposition Plan:  Status is: Inpatient Remains inpatient appropriate because: admitted with lung mass. Chest pain     Consultants:  Pulmonologist   Procedures:  CTA  Antimicrobials:    Subjective: Report some improvement of chest abdominal pain.  He was able to eat broth.    Objective: Vitals:   08/11/21 1559 08/11/21 2158 08/12/21 0553 08/12/21 0857  BP: 137/88 135/77  133/82   Pulse: 87 85 91   Resp: 18 18 20    Temp:  98.3 F (36.8 C) 97.8 F (36.6 C)   TempSrc:  Oral  Oral   SpO2: 95% 95% 95% 95%  Weight:      Height:        Intake/Output Summary (Last 24 hours) at 08/12/2021 1322 Last data filed at 08/12/2021 1300 Gross per 24 hour  Intake 2732.42 ml  Output --  Net 2732.42 ml    Filed Weights   08/05/21 0901 08/05/21 1827 08/11/21 1130  Weight: 49.8 kg 48.6 kg 48.6 kg    Examination:  General exam: NAD Respiratory system: CTA Cardiovascular system: S 1, S 2 RRR Gastrointestinal system: BS present, soft, nt Central nervous system: Alert, follows command Extremities: No edema    Data Reviewed: I have personally reviewed following labs and imaging studies  CBC: Recent Labs  Lab 08/08/21 0412 08/08/21 1149 08/09/21 0819 08/11/21 0841 08/12/21 0330  WBC 14.6* 15.5* 14.6* 14.8* 12.2*  HGB 13.4 13.8 13.3 13.5 12.1*  HCT 40.7 40.9 41.1 40.6 37.1*  MCV 90.0 88.7 90.3 89.0 89.2  PLT 281 273 281 287 502    Basic Metabolic Panel: Recent Labs  Lab 08/08/21 0412 08/08/21 1149 08/09/21 0819 08/11/21 0841 08/12/21 0330  NA 135 139 139 136 136  K 3.4* 3.4* 4.4 3.3* 4.5  CL 97* 98 101 99 102  CO2 28 29 30 28 28   GLUCOSE 127* 124* 107* 106* 141*  BUN 22 23 24* 14 16  CREATININE 0.95 0.86 0.75 0.74 0.70  CALCIUM 9.0 9.5 9.3 8.8* 8.6*    GFR: Estimated Creatinine Clearance: 57.4 mL/min (by C-G formula based on SCr of 0.7 mg/dL). Liver Function Tests: Recent Labs  Lab 08/06/21 0402 08/11/21 0841  AST 25 22  ALT 24 22  ALKPHOS 91 83  BILITOT 1.0 0.9  PROT 6.9 6.6  ALBUMIN 3.6 3.1*    No results for input(s): LIPASE, AMYLASE in the last 168 hours.  No results for input(s): AMMONIA in the last 168 hours. Coagulation Profile: Recent Labs  Lab 08/11/21 0841  INR 1.3*    Cardiac Enzymes: No results for input(s): CKTOTAL, CKMB, CKMBINDEX, TROPONINI in the last 168 hours. BNP (last 3 results) No results for input(s): PROBNP in the last 8760 hours. HbA1C: No results for input(s): HGBA1C in the last 72  hours. CBG: No results for input(s): GLUCAP in the last 168 hours. Lipid Profile: No results for input(s): CHOL, HDL, LDLCALC, TRIG, CHOLHDL, LDLDIRECT in the last 72 hours. Thyroid Function Tests: No results for input(s): TSH, T4TOTAL, FREET4, T3FREE, THYROIDAB in the last 72 hours. Anemia Panel: No results for input(s): VITAMINB12, FOLATE, FERRITIN, TIBC, IRON, RETICCTPCT in the last 72 hours.  Sepsis Labs: No results for input(s): PROCALCITON, LATICACIDVEN in the last 168 hours.  Recent Results (from the past 240 hour(s))  Gram stain     Status: None   Collection Time: 08/04/21 10:33 AM   Specimen: PATH Cytology Pleural fluid  Result Value Ref Range Status   Specimen Description PLEURAL FLUID  Final   Special Requests NONE  Final   Gram Stain   Final    NO WBC SEEN NO ORGANISMS SEEN Performed at [facility], [address] [place], [place] [address]    Report Status 08/04/2021 FINAL  Final  Fungus Culture With Stain     Status: None (Preliminary result)   Collection Time: 08/04/21 10:33 AM   Specimen:  PATH Cytology Pleural fluid  Result Value Ref Range Status   Fungus Stain Final report  Final    Comment: (NOTE) Performed At: [facility] [place], [place] [id number] Rush Farmer MD [id number]    Fungus (Mycology) Culture PENDING  Incomplete   Fungal Source PLEURAL  Final    Comment: FLUID Performed at [facility], [place] [address] [place], [place] [address]   Acid Fast Smear (AFB)     Status: None   Collection Time: 08/04/21 10:33 AM   Specimen: PATH Cytology Pleural fluid  Result Value Ref Range Status   AFB Specimen Processing Concentration  Final   Acid Fast Smear Negative  Final    Comment: (NOTE) Performed At: [facility] [place], [place] [id number] Rush Farmer MD VO:[id number]    Source (AFB) PLEURAL  Final    Comment: FLUID Performed at [facility], [place] [address]  [place], [place] [address]   Fungus Culture Result     Status: None   Collection Time: 08/04/21 10:33 AM  Result Value Ref Range Status   Result 1 Comment  Final    Comment: (NOTE) KOH/Calcofluor preparation:  no fungus observed. Performed At: [facility] [place], [place] [id number] Rush Farmer MD OF:[id number]   Blood culture (routine x 2)     Status: None   Collection Time: 08/05/21  9:28 AM   Specimen: Right Antecubital; Blood  Result Value Ref Range Status   Specimen Description   Final    RIGHT ANTECUBITAL Performed at [facility], [address], [place], [place] [address]    Special Requests   Final    BOTTLES DRAWN AEROBIC AND ANAEROBIC Blood Culture results may not be optimal due to an inadequate volume of blood received in culture bottles Performed at [facility], [address] [place], [place] [address]    Culture   Final    NO GROWTH 5 DAYS Performed at [facility], [place] [address] [place], [place] [address]    Report Status 08/10/2021 FINAL  Final  Blood culture (routine x 2)     Status: None   Collection Time: 08/05/21  9:29 AM   Specimen: BLOOD LEFT FOREARM  Result Value Ref Range Status   Specimen Description   Final    BLOOD LEFT FOREARM Performed at [facility], [address] [place], [place] [address]    Special Requests   Final    BOTTLES DRAWN AEROBIC AND ANAEROBIC Blood Culture adequate volume Performed at [facility], [address], [place], [place] [address]    Culture   Final    NO GROWTH 5 DAYS Performed at [facility], [place] [address] [place], [place] [address]    Report Status 08/10/2021 FINAL  Final  Anaerobic culture w Gram Stain     Status: None (Preliminary result)   Collection Time: 08/11/21 12:55 PM   Specimen: PATH Cytology FNA; Body Fluid  Result Value Ref Range Status   Specimen Description   Final     FLUID Performed at [place] [address], [place], [place] [address]    Special Requests ESOPHAGOGASTRODUODENOSCOPY BODY FLUID  Final   Gram Stain   Final    FEW WBC PRESENT, PREDOMINANTLY MONONUCLEAR NO ORGANISMS SEEN Performed at [facility], [place] [address] [place],  [address]    Culture PENDING  Incomplete   Report Status  PENDING  Incomplete  Body fluid culture w Gram Stain     Status: None (Preliminary result)   Collection Time: 08/11/21 12:55 PM   Specimen: PATH Cytology FNA; Body Fluid  Result Value Ref Range Status   Specimen Description   Final    FLUID Performed at [facility] [address], [place], [place] [address]    Special Requests ESOPHAGOGASTRODUODENOSCOPY BODY FLUID  Final   Gram Stain   Final    FEW WBC PRESENT, PREDOMINANTLY MONONUCLEAR NO ORGANISMS SEEN    Culture   Final    NO GROWTH < 24 HOURS Performed at [facility], [place] [address] [place], [place] [address]    Report Status PENDING  Incomplete          Radiology Studies: No results found.      Scheduled Meds:  atorvastatin  80 mg Oral Daily   clopidogrel  75 mg Oral Daily   diltiazem  120 mg Oral Daily   feeding supplement  1 Container Oral TID BM   feeding supplement  237 mL Oral BID BM   fluticasone furoate-vilanterol  1 puff Inhalation Daily   And   umeclidinium bromide  1 puff Inhalation Daily   mouth rinse  15 mL Mouth Rinse BID   multivitamin with minerals  1 tablet Oral Daily   nystatin  5 mL Oral QID   pantoprazole  40 mg Oral Daily   polyethylene glycol  17 g Oral BID   senna-docusate  1 tablet Oral BID   sodium phosphate  1 enema Rectal Once   Vitamin D (Ergocalciferol)  50,000 Units Oral Q7 days   Continuous Infusions:  lactated ringers 75 mL/hr at 08/12/21 0459     LOS: 7 days    Time spent: 35 minutes.     Elmarie Shiley, MD [facility]   If 7PM-7AM, please contact  night-coverage [email or web address]  08/12/2021, 1:22 PM

## 2021-08-12 NOTE — Consult Note (Signed)
Hematology/Oncology Consult Note  Clinical Summary: Alex [name] is a 71 year old male who presents for worsening chest pain and pleural effusion who was found to have a new diagnosis of squamous cell carcinoma of the lung.  History of Present Illness:  Alex [name] is a 71 year old male who initially presented to the emergency department on 08/05/2021 at which time he had a CT angiogram of the chest abdomen and pelvis performed which showed a mass concerning for bronchogenic carcinoma with metastasis.  CT scan showed a heterogeneous enhancing consolidation of the medial left lower lobe with areas of cavitation measuring approximately 6.0 x 4.3 x 9.6 cm.  There is also noted to be necrotic tumor extending into the upper abdomen to the diaphragmatic hiatus abutting the celiac axis and renal vein.  There was noted to be mildly enlarged left hilar and ill-defined subcarinal lymph node as well as a left pleural effusion.  Thoracentesis performed on 08/04/2021 showed no evidence of malignant cells, with mixed inflammatory cells.  On 08/08/2021 the patient underwent endobronchial ultrasound with biopsy which confirmed squamous cell carcinoma of the lung.  He also underwent an EGD on 08/11/2021 which showed achalasia.  Due to concern for this new diagnosis of squamous cell carcinoma of the lung oncology was consulted for further evaluation and management.  On exam today Alex [name] is accompanied by his wife who helps provide the history as he is somewhat confused.  He reports that he typically walks about 5 to 10 miles per week but it was getting progressively more difficult.  He notes that he was becoming more short of breath and having difficulties with chest pain and low stamina.  Additionally over the last 3 weeks his p.o. intake has declined markedly and he has lost over 20 pounds in that time.  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  He reports that he has not been having any issues with pain.  He does endorse that he has "slowed down" and is having some confusion.  He endorses a history of smoking from age 54 up into his 59s at about 2 packs/day.  He currently denies any fevers, chills, sweats, nausea, vomiting or diarrhea.  He remains on a liquid diet.  A full 10 point ROS is listed below.  O:  Vitals:   08/12/21 0857 08/12/21 1324  BP:  118/69  Pulse:  80  Resp:  20  Temp:  97.8 F (36.6 C)  SpO2: 95% 93%      Latest Ref Rng & Units 08/12/2021    3:30 AM 08/11/2021    8:41 AM 08/09/2021    8:19 AM  CMP  Glucose 70 - 99 mg/dL 141   106   107    BUN 8 - 23 mg/dL 16   14   24     Creatinine 0.61 - 1.24 mg/dL 0.70   0.74   0.75    Sodium 135 - 145 mmol/L 136   136   139    Potassium 3.5 - 5.1 mmol/L 4.5   3.3   4.4    Chloride 98 - 111 mmol/L 102   99   101    CO2 22 - 32 mmol/L 28   28   30     Calcium 8.9 - 10.3 mg/dL 8.6   8.8   9.3    Total Protein 6.5 - 8.1 g/dL  6.6  Total Bilirubin 0.3 - 1.2 mg/dL  0.9     Alkaline Phos 38 - 126 U/L  83     AST 15 - 41 U/L  22     ALT 0 - 44 U/L  22         Latest Ref Rng & Units 08/12/2021    3:30 AM 08/11/2021    8:41 AM 08/09/2021    8:19 AM  CBC  WBC 4.0 - 10.5 K/uL 12.2   14.8   14.6    Hemoglobin 13.0 - 17.0 g/dL 12.1   13.5   13.3    Hematocrit 39.0 - 52.0 % 37.1   40.6   41.1    Platelets 150 - 400 K/uL 250   287   281        GENERAL: chronically ill appearing elderly Caucasian male in NAD  SKIN: skin color, texture, turgor are normal, no rashes or significant lesions EYES: conjunctiva are pink and non-injected, sclera clear LUNGS: clear to auscultation and percussion with normal breathing effort HEART: regular rate & rhythm and no murmurs and no lower extremity edema Musculoskeletal: no cyanosis of digits and no clubbing  PSYCH: alert & oriented x 3, fluent speech NEURO: no focal motor/sensory  deficits  Assessment/Plan: Alex [name] is a 71 year old male who presents for worsening chest pain and pleural effusion who was found to have a new diagnosis of squamous cell carcinoma of the lung.  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 --will need to consider port placement. Will discuss with patient and wife once staging is complete.  --barriers to d/c include poor PO intake and pneumonia. Once medical stable OK to d/c from Med/Onc standpoint.  --Plan for close outpatient follow-up once discharged from the hospital.    Ledell Peoples, MD Department of Hematology/Oncology [place] [facility] Phone: [phone] Pager: [phone] Email: [email or web address]

## 2021-08-12 NOTE — Care Management Important Message (Signed)
Important Message  Patient Details IM Letter given to the Patient. Name: Alex [name] MRN: [id number] Date of Birth: 23-Oct-1950   Medicare Important Message Given:  Yes     Kerin Salen 08/12/2021, 11:46 AM

## 2021-08-12 NOTE — Progress Notes (Signed)
PCCM Update:  Patient tolerated procedure well. Spoke with patient and his wife yesterday afternoon that the preliminary biopsy results are concerning for cancer. Oncology has been consulted.   Patient to follow up in pulmonary clinic as needed. Please call with any further questions.  PCCM will sign off.  Freda Jackson, MD [facility] Office: [phone]   See Amion for personal pager PCCM on call pager [phone] until 7pm. Please call Elink 7p-7a. [phone]

## 2021-08-12 NOTE — Progress Notes (Signed)
[facility] Progress Note  Mekai Wilkinson Pogosyan 71 y.o. [birth year]  Subjective: Seen lying examined in bed.  Patient notes he has some neck pain.  No bowel movement since procedure.  Patient has not tried eating or drinking since procedure.  Denies abdominal pain.  ROS : Review of Systems  Gastrointestinal:  Negative for abdominal pain, blood in stool, constipation, diarrhea, heartburn, melena, nausea and vomiting.  Genitourinary:  Negative for dysuria and urgency.  Musculoskeletal:  Positive for neck pain.     Objective: Vital signs in last 24 hours: Vitals:   08/12/21 0553 08/12/21 0857  BP: 133/82   Pulse: 91   Resp: 20   Temp: 97.8 F (36.6 C)   SpO2: 95% 95%    Physical Exam:  General:  alert, cooperative, no distress, appears stated age  Head:  Normocephalic, without obvious abnormality, atraumatic  Eyes:  Anicteric sclera, EOM's intact  Lungs:   Clear to auscultation bilaterally, respirations unlabored  Heart:  Regular rate and rhythm, S1, S2 normal  Abdomen:   Soft, non-tender, bowel sounds active all four quadrants,  no masses,   Extremities: Extremities normal, atraumatic, no  edema  Pulses: 2+ and symmetric    Lab Results: Recent Labs    08/11/21 0841 08/12/21 0330  NA 136 136  K 3.3* 4.5  CL 99 102  CO2 28 28  GLUCOSE 106* 141*  BUN 14 16  CREATININE 0.74 0.70  CALCIUM 8.8* 8.6*   Recent Labs    08/11/21 0841  AST 22  ALT 22  ALKPHOS 83  BILITOT 0.9  PROT 6.6  ALBUMIN 3.1*   Recent Labs    08/11/21 0841 08/12/21 0330  WBC 14.8* 12.2*  HGB 13.5 12.1*  HCT 40.6 37.1*  MCV 89.0 89.2  PLT 287 250   Recent Labs    08/11/21 0841  LABPROT 15.6*  INR 1.3*      Assessment Dysphagia Abnormal CT scan Weight loss  EGD with dilation 08/11/2021 - The esophageal examination was consistent with achalasia. Dilated. - Small hiatal hernia. - The examination was otherwise normal. - No specimens collected.  CT abdomen pelvis with  contrast 08/05/2021 Constellation of findings is concerning for primary bronchogenic carcinoma with mediastinal invasion.  Necrotic tumor also extends into the upper abdomen through the diaphragmatic hiatus and abuts the celiac axis and left renal vein.  Findings of EGD significant for achalasia.  Patient was dilated.  We will evaluate for ability to tolerate diet or need for further intervention.  Plan: Continue supportive care Encourage patient to trial clear liquid diet if tolerated consider advancing diet. Continue MiraLAX twice daily for constipation.\ Continue Protonix 40 mg daily. [facility] will follow.  Arvella Nigh Milledge Gerding PA-C 08/12/2021, 1:04 PM  Contact #  [phone]

## 2021-08-13 ENCOUNTER — Institutional Professional Consult (permissible substitution): Payer: Medicare Other | Admitting: Pulmonary Disease

## 2021-08-13 DIAGNOSIS — R079 Chest pain, unspecified: Secondary | ICD-10-CM

## 2021-08-13 DIAGNOSIS — C3492 Malignant neoplasm of unspecified part of left bronchus or lung: Secondary | ICD-10-CM | POA: Diagnosis not present

## 2021-08-13 DIAGNOSIS — I25118 Atherosclerotic heart disease of native coronary artery with other forms of angina pectoris: Secondary | ICD-10-CM | POA: Diagnosis not present

## 2021-08-13 DIAGNOSIS — E43 Unspecified severe protein-calorie malnutrition: Secondary | ICD-10-CM

## 2021-08-13 DIAGNOSIS — J449 Chronic obstructive pulmonary disease, unspecified: Secondary | ICD-10-CM

## 2021-08-13 DIAGNOSIS — E876 Hypokalemia: Secondary | ICD-10-CM

## 2021-08-13 DIAGNOSIS — F419 Anxiety disorder, unspecified: Secondary | ICD-10-CM

## 2021-08-13 DIAGNOSIS — B370 Candidal stomatitis: Secondary | ICD-10-CM

## 2021-08-13 DIAGNOSIS — R1319 Other dysphagia: Secondary | ICD-10-CM

## 2021-08-13 LAB — CYTOLOGY - NON PAP

## 2021-08-13 MED ORDER — FLEET ENEMA 7-19 GM/118ML RE ENEM: 1.0000 | ENEMA | Freq: Every day | RECTAL | Status: DC | PRN

## 2021-08-13 NOTE — Progress Notes (Signed)
PROGRESS NOTE  Alex [name] DTO:[id number] DOB: 1950/06/17   PCP: Alroy Dust, L.Marlou Sa, MD  Patient is from: Home.  Lives with his wife.  DOA: 08/05/2021 LOS: 8  Chief complaints Chief Complaint  Patient presents with   Chest Pain     Brief Narrative / Interim history: 71 year old M with PMH of CAD/CABG in 02/2021 presenting with lingering left chest pain for some time for which he was evaluated outpatient by cardiologist including PET scan that showed pleural effusion and consolidation, and he underwent thoracocentesis 2 days prior to admission.   CT angio chest/abdomen/pelvis showed lung mass concerning for bronchogenic carcinoma with metastasis.  Underwent bronchoscopy on 5/22.  Preliminary report concerning for NSCLC, and oncology consulted..    Patient also underwent endoscopy that showed achalasia for which she had dilation.  Advancing diet slowly.    Subjective: Seen and examined earlier this morning.  No major events overnight of this morning.  Continues to endorse severe left-sided chest pain.  He rates his pain 14/10 although he does not appear to be in that much distress.  No GI or UTI symptoms.  He says he has not had bowel movement in 4 weeks although his wife thinks LBM was 3 days ago.  He is already on the schedule Senokot and MiraLAX with as needed enema.  Objective: Vitals:   08/12/21 1324 08/12/21 2123 08/13/21 0527 08/13/21 0808  BP: 118/69 126/82 133/77   Pulse: 80 73 77   Resp: 20 20 16    Temp: 97.8 F (36.6 C) 97.8 F (36.6 C) 97.9 F (36.6 C)   TempSrc: Oral Oral Oral   SpO2: 93% 94% 96% 92%  Weight:      Height:        Examination:  GENERAL: Appears frail. HEENT: MMM.  Vision and hearing grossly intact.  NECK: Supple.  No apparent JVD.  RESP:  No IWOB.  Fair aeration bilaterally.  Crackles over left lung. CVS:  RRR. Heart sounds normal.  ABD/GI/GU: BS+. Abd soft, NTND.  MSK/EXT:  Moves extremities.  Significant muscle mass and subcu fat  loss. SKIN: no apparent skin lesion or wound NEURO: Awake, alert and oriented appropriately.  No apparent focal neuro deficit. PSYCH: Calm. Normal affect.   Procedures:  5/22-bronchoscopy and EGD  Microbiology summarized: Blood cultures NGTD. Fluid culture from bronchoscopy NGTD AFB smear and fungal culture pending  Assessment and Plan: * Metastatic adenocarcinoma of the lung S/p bronchoscopy on 5/22.  Pathology concerning for metastatic adenocarcinoma of the lung.  Concern about extension into abdomen and pleura.  MRI brain negative. -Appreciate input by oncology-added genetic testing to sample  Esophageal dysphagia EGD showed achalasia.  He underwent dilation on 5/22.  Tolerated clear liquid diet. -Advance to full liquid diet  Left-sided chest pain Likely from malignancy but difficult to rule out superimposed pneumonia.  Completed antibiotic course with ceftriaxone and azithromycin for 5 days.  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  Oral candidiasis Continue nystatin  Anxiety disorder Continue Klonopin  CAD s/p stent in 12/22 -Back on DAPT after bronchoscopy and EGD. -Continue statin   DVT prophylaxis:  SCDs Start: 08/05/21 1929  Code Status: Full code Family Communication: Patient's wife and friend at bedside Level of care: Progressive.  Change level of care to telemetry Status is: Inpatient Remains inpatient appropriate because: Left chest pain requiring IV pain medication  and poor p.o. intake in the setting of dysphagia   Final disposition: Home with home health once medically cleared Consultants:  Pulmonology Gastroenterology Oncology  Sch Meds:  Scheduled Meds:  atorvastatin  80 mg Oral Daily   clopidogrel  75 mg Oral Daily   diltiazem  120 mg Oral Daily   feeding supplement  1 Container Oral TID BM   feeding supplement  237 mL Oral BID BM   fluticasone furoate-vilanterol  1 puff Inhalation Daily   And   umeclidinium bromide  1 puff Inhalation Daily   mouth rinse  15 mL Mouth Rinse BID   multivitamin with minerals  1 tablet Oral Daily   nystatin  5 mL Oral QID   pantoprazole  40 mg Oral Daily   polyethylene glycol  17 g Oral BID   senna-docusate  1 tablet Oral BID   Vitamin D (Ergocalciferol)  50,000 Units Oral Q7 days   Continuous Infusions:   PRN Meds:.albuterol, clonazePAM, morphine injection, nitroGLYCERIN, ondansetron (ZOFRAN) IV, oxyCODONE, sodium phosphate  Antimicrobials: Anti-infectives (From admission, onward)    Start     Dose/Rate Route Frequency Ordered Stop   08/08/21 1400  fluconazole (DIFLUCAN) IVPB 100 mg  Status:  Discontinued        100 mg 50 mL/hr over 60 Minutes Intravenous Every 24 hours 08/08/21 1236 08/12/21 1012   08/06/21 1300  azithromycin (ZITHROMAX) 500 mg in sodium chloride 0.9 % 250 mL IVPB  Status:  Discontinued        500 mg 250 mL/hr over 60 Minutes Intravenous Every 24 hours 08/05/21 1931 08/11/21 0959   08/06/21 1200  cefTRIAXone (ROCEPHIN) 2 g in sodium chloride 0.9 % 100 mL IVPB        2 g 200 mL/hr over 30 Minutes Intravenous Every 24 hours 08/05/21 1931 08/12/21 1229   08/05/21 1200  cefTRIAXone (ROCEPHIN) 1 g in sodium chloride 0.9 % 100 mL IVPB        1 g 200 mL/hr over 30 Minutes Intravenous  Once 08/05/21 1156 08/05/21 1314   08/05/21 1200  azithromycin (ZITHROMAX) 500 mg in sodium chloride 0.9 % 250 mL IVPB        500 mg 250 mL/hr over 60 Minutes Intravenous  Once 08/05/21 1156 08/05/21  1425        I have personally reviewed the following labs and images: CBC: Recent Labs  Lab 08/08/21 0412 08/08/21 1149 08/09/21 0819 08/11/21 0841 08/12/21 0330  WBC 14.6* 15.5* 14.6* 14.8* 12.2*  HGB 13.4 13.8 13.3 13.5 12.1*  HCT 40.7 40.9 41.1 40.6 37.1*  MCV 90.0 88.7 90.3 89.0 89.2  PLT 281 273 281 287 250   BMP &GFR Recent Labs  Lab 08/08/21 0412 08/08/21 1149 08/09/21 0819 08/11/21 0841 08/12/21 0330  NA 135 139 139 136 136  K 3.4* 3.4* 4.4 3.3* 4.5  CL 97* 98 101 99 102  CO2 28 29 30 28 28   GLUCOSE 127* 124* 107* 106* 141*  BUN 22 23 24* 14 16  CREATININE 0.95 0.86 0.75 0.74 0.70  CALCIUM 9.0 9.5 9.3 8.8* 8.6*   Estimated Creatinine Clearance: 57.4 mL/min (by C-G formula based on SCr of 0.7 mg/dL). Liver & Pancreas: Recent Labs  Lab 08/11/21 0841  AST 22  ALT 22  ALKPHOS 83  BILITOT 0.9  PROT 6.6  ALBUMIN 3.1*   No results for input(s): LIPASE, AMYLASE in the last 168 hours. No results for input(s): AMMONIA in the last 168 hours. Diabetic: No results for input(s): HGBA1C in the last 72 hours. No results for input(s): GLUCAP in the last 168 hours. Cardiac Enzymes: No results for input(s): CKTOTAL, CKMB, CKMBINDEX, TROPONINI in the last 168 hours. No results for input(s): PROBNP in the last 8760 hours. Coagulation Profile: Recent Labs  Lab 08/11/21 0841  INR 1.3*   Thyroid Function Tests: No results for input(s): TSH, T4TOTAL, FREET4, T3FREE, THYROIDAB in the last 72 hours. Lipid Profile: No results for input(s): CHOL, HDL, LDLCALC, TRIG, CHOLHDL, LDLDIRECT in the last 72 hours. Anemia Panel: No results for input(s): VITAMINB12, FOLATE, FERRITIN, TIBC, IRON, RETICCTPCT in the last 72 hours. Urine analysis:    Component Value Date/Time   COLORURINE YELLOW 07/01/2009 1234   APPEARANCEUR CLEAR 07/01/2009 1234   LABSPEC 1.017 07/01/2009 1234   PHURINE 7.5 07/01/2009 1234   GLUCOSEU NEGATIVE 07/01/2009 1234   HGBUR NEGATIVE 07/01/2009  1234   BILIRUBINUR NEGATIVE 07/01/2009 1234   KETONESUR NEGATIVE 07/01/2009 1234   PROTEINUR NEGATIVE 07/01/2009 1234   UROBILINOGEN 1.0 07/01/2009 1234   NITRITE NEGATIVE 07/01/2009 1234   LEUKOCYTESUR  07/01/2009 1234    NEGATIVE MICROSCOPIC NOT DONE ON URINES WITH NEGATIVE PROTEIN, BLOOD, LEUKOCYTES, NITRITE, OR GLUCOSE <1000 mg/dL.   Sepsis Labs: Invalid input(s): PROCALCITONIN, [place]  Microbiology: Recent Results (from the past 240 hour(s))  Gram stain     Status: None   Collection Time: 08/04/21 10:33 AM   Specimen: PATH Cytology Pleural fluid  Result Value Ref Range Status   Specimen Description PLEURAL FLUID  Final   Special Requests NONE  Final   Gram Stain   Final    NO WBC SEEN NO ORGANISMS SEEN Performed at [facility], [place] [address] [place], [place] [address]    Report Status 08/04/2021 FINAL  Final  Fungus Culture With Stain     Status: None (Preliminary result)   Collection Time: 08/04/21 10:33 AM   Specimen: PATH Cytology Pleural fluid  Result Value Ref Range Status   Fungus Stain Final report  Final    Comment: (NOTE) Performed At: [facility] [place], [place] [id number] Rush Farmer MD [id number]    Fungus (Mycology) Culture PENDING  Incomplete   Fungal Source PLEURAL  Final    Comment: FLUID Performed at [facility], [place] [address] [place], [place] [address]   Acid Fast Smear (AFB)     Status: None   Collection Time: 08/04/21 10:33 AM   Specimen: PATH Cytology Pleural fluid  Result Value Ref Range Status   AFB Specimen Processing Concentration  Final   Acid Fast Smear Negative  Final    Comment: (NOTE) Performed At: [facility] [place], [place] [id number] Rush Farmer MD EH:[id number]    Source (AFB) PLEURAL  Final    Comment: FLUID Performed at [facility], [place] [address] [place], [place] [address]   Fungus Culture Result     Status: None   Collection  Time: 08/04/21 10:33 AM  Result Value Ref Range Status   Result 1 Comment  Final    Comment: (NOTE) KOH/Calcofluor preparation:  no fungus observed. Performed At: [facility] [place], [place] [id number] Rush Farmer MD [id number]   Blood culture (routine x 2)  Status: None   Collection Time: 08/05/21  9:28 AM   Specimen: Right Antecubital; Blood  Result Value Ref Range Status   Specimen Description   Final    RIGHT ANTECUBITAL Performed at [facility], [address], [place], [place] [address]    Special Requests   Final    BOTTLES DRAWN AEROBIC AND ANAEROBIC Blood Culture results may not be optimal due to an inadequate volume of blood received in culture bottles Performed at [facility], [address] [place], [place] [address]    Culture   Final    NO GROWTH 5 DAYS Performed at [facility], [place] [address], [place], [place] [address]    Report Status 08/10/2021 FINAL  Final  Blood culture (routine x 2)     Status: None   Collection Time: 08/05/21  9:29 AM   Specimen: BLOOD LEFT FOREARM  Result Value Ref Range Status   Specimen Description   Final    BLOOD LEFT FOREARM Performed at [facility], [address], [place], [place] [address]    Special Requests   Final    BOTTLES DRAWN AEROBIC AND ANAEROBIC Blood Culture adequate volume Performed at [facility], [address] [place], [place] [address]    Culture   Final    NO GROWTH 5 DAYS Performed at [facility], [place] [address] [place], [place] [address]    Report Status 08/10/2021 FINAL  Final  Acid Fast Smear (AFB)     Status: None   Collection Time: 08/11/21 12:55 PM   Specimen: PATH Cytology FNA; Body Fluid  Result Value Ref Range Status   AFB Specimen Processing Concentration  Final   Acid Fast Smear Comment  Final    Comment: (NOTE) Testing is temporarily delayed pending receipt  of testing materials from the manufacturer. We apologize for any inconvenience. Performed At: [facility] [place], [place] [id number] Rush Farmer MD CW:[id number]    Source (AFB) FLUID  Final    Comment: Performed at [facility], [place] [address], [place], [place] [address]  Anaerobic culture w Gram Stain     Status: None (Preliminary result)   Collection Time: 08/11/21 12:55 PM   Specimen: PATH Cytology FNA; Body Fluid  Result Value Ref Range Status   Specimen Description   Final    FLUID Performed at [place] [address], [place], [place] [address]    Special Requests ESOPHAGOGASTRODUODENOSCOPY BODY FLUID  Final   Gram Stain   Final    FEW WBC PRESENT, PREDOMINANTLY MONONUCLEAR NO ORGANISMS SEEN Performed at [facility], [place] [address] [place], [place] [address]    Culture   Final    NO ANAEROBES ISOLATED; CULTURE IN PROGRESS FOR 5 DAYS   Report Status PENDING  Incomplete  Body fluid culture w Gram Stain     Status: None (Preliminary result)   Collection Time: 08/11/21 12:55 PM   Specimen: PATH Cytology FNA; Body Fluid  Result Value Ref Range Status   Specimen Description   Final    FLUID Performed at [place] [address] [place], [place] [address]    Special Requests ESOPHAGOGASTRODUODENOSCOPY BODY FLUID  Final   Gram Stain   Final    FEW WBC PRESENT, PREDOMINANTLY MONONUCLEAR NO ORGANISMS SEEN    Culture   Final    NO GROWTH 2 DAYS Performed at [facility], [place]  [address], [place], [place] [address]    Report Status PENDING  Incomplete    Radiology Studies: MR BRAIN W WO CONTRAST  Result Date: 08/12/2021 CLINICAL DATA:  Non-small cell lung cancer, staging EXAM: MRI HEAD WITHOUT AND WITH CONTRAST TECHNIQUE: Multiplanar, multiecho pulse sequences of the brain and surrounding structures were obtained without and with intravenous contrast. CONTRAST:  23mL  GADAVIST GADOBUTROL 1 MMOL/ML IV SOLN COMPARISON:  None Available. FINDINGS: Brain: No restricted diffusion to suggest acute or subacute infarct. No acute hemorrhage mass, mass effect, or midline shift. No hydrocephalus or extra-axial collection. No abnormal parenchymal or meningeal enhancement. Vascular: Normal arterial flow voids. Skull and upper cervical spine: Normal marrow signal. Sinuses/Orbits: Mucosal thickening in the right maxillary sinus and anterior right ethmoid air cells. Status post bilateral lens replacements. Other: Trace fluid in the left mastoid air cells. IMPRESSION: No acute intracranial process. No evidence of metastatic disease in the brain. Electronically Signed   By: Merilyn Baba M.D.   On: 08/12/2021 23:27      Madoc Holquin T. [place]  If 7PM-7AM, please contact night-coverage [email or web address] 08/13/2021, 4:58 PM

## 2021-08-13 NOTE — Assessment & Plan Note (Addendum)
Likely from malignancy but difficult to rule out superimposed pneumonia.  Completed antibiotic course with ceftriaxone and azithromycin for 5 days.  -Discharged with Norco and bowel regimen

## 2021-08-13 NOTE — Assessment & Plan Note (Addendum)
Resolved

## 2021-08-13 NOTE — Assessment & Plan Note (Addendum)
S/p bronchoscopy on 5/22.  Pathology concerning for metastatic adenocarcinoma of the lung.  Concern about extension into abdomen and pleura.  MRI brain negative. -Oncology to arrange outpatient follow-up after genetic testing result -Prescribed Norco for pain control -Bowel regimens

## 2021-08-13 NOTE — Assessment & Plan Note (Signed)
Resolved

## 2021-08-13 NOTE — Assessment & Plan Note (Addendum)
Continue on Trelegy Ellipta with as needed albuterol

## 2021-08-13 NOTE — Assessment & Plan Note (Signed)
Nutrition Status: Nutrition Problem: Severe Malnutrition Etiology: chronic illness (CAD, aortic atherosclerosis) Signs/Symptoms: percent weight loss, severe fat depletion, severe muscle depletion Percent weight loss: 19 % Interventions: Boost Breeze, MVI, Magic cup, Refer to RD note for recommendations

## 2021-08-13 NOTE — Assessment & Plan Note (Signed)
Continue Klonopin

## 2021-08-13 NOTE — Hospital Course (Addendum)
71 year old M with PMH of CAD/CABG in 02/2021 presenting with lingering left chest pain for some time for which he was evaluated outpatient by cardiologist including PET scan that showed pleural effusion and consolidation, and he underwent thoracocentesis 2 days prior to admission.   CT angio chest/abdomen/pelvis showed lung mass concerning for bronchogenic carcinoma with metastasis.  Underwent bronchoscopy on 5/22.  Preliminary report concerning for adenocarcinoma of the lung, and oncology consulted, and added genetic testing.  Oncology to arrange outpatient follow-up to discuss treatment options.  Patient also underwent endoscopy that showed achalasia for which she had dilation.  Diet advanced and he tolerated soft diet prior to discharge.

## 2021-08-13 NOTE — Assessment & Plan Note (Addendum)
EGD showed achalasia.  He underwent dilation on 5/22.  Tolerated soft diet prior to discharge.

## 2021-08-13 NOTE — Assessment & Plan Note (Addendum)
Stable.  Continue DAPT and statin

## 2021-08-13 NOTE — Care Management Important Message (Signed)
Important Message  Patient Details IM Letter given to the Patient. Name: Alex [name] MRN: [id number] Date of Birth: 07-Aug-1950   Medicare Important Message Given:  Yes     Kerin Salen 08/13/2021, 9:38 AM

## 2021-08-13 NOTE — Progress Notes (Signed)
Nutrition Follow-up  DOCUMENTATION CODES:   Severe malnutrition in context of chronic illness, Underweight  INTERVENTION:  - continue Boost Breeze TID and Ensure Plus High Protein BID.   - if PO intakes remain inadequate, recommend consideration of feeding tube placement and initiation of tube feeding if within goals of care.   NUTRITION DIAGNOSIS:   Severe Malnutrition related to chronic illness (CAD, aortic atherosclerosis) as evidenced by percent weight loss, severe fat depletion, severe muscle depletion. -ongoing  GOAL:   Patient will meet greater than or equal to 90% of their needs -unmet  MONITOR:   PO intake, Supplement acceptance, Diet advancement, Labs, Weight trends  ASSESSMENT:   Pt admitted with chest pain. PMH significant for CAD s/p stenting 02/2021, anxiety, aortic atherosclerosis, carotid artery occlusion, GERD, and HLD.  Diet changed from Regular to NPO on 5/22 at midnight and advanced to CLD that afternoon and then to FLD today at 1334. He was able to consume nearly all of lunch on CLD today and had not yet tried any full liquids.   He had been accepting Ensure ~75% of the time offered but declined both bottles today. He had been accepting Boost Breeze ~75% of the time offered but declined the 2 bottles offered so far today; will be offered third bottle later this evening.   Weight on 5/16 was 107 lb and weight on 5/22 was the same with no weights recorded between those dates of since 5/22.  No information documented in the edema section of flow sheet this hospitalization.   GI following. Patient with dysphagia and underwent endoscopy with findings of achalasia and he underwent dilation.  Notes indicate preliminary report of NSCLC, findings concerning for bronchogenic carcinoma with superimposed LLL PNA. Necrotic tumor extending into upper abdomen and necrotic nodule posterior to the R kidney   Labs reviewed; Ca: 8.6 mg/dl.  Medications reviewed; 1 tablet  multivitamin with minerals/day, 40 mg oral protonix/day, 17 g miralax BID, 1 tablet senokot BID, 57846 units drisdol every 7 days starting 5/18.  IVF; LR @ 75 ml/hr.   Diet Order:   Diet Order             Diet full liquid Room service appropriate? Yes; Fluid consistency: Thin  Diet effective now                   EDUCATION NEEDS:   Education needs have been addressed  Skin:  Skin Assessment: Reviewed RN Assessment  Last BM:  unknown  Height:   Ht Readings from Last 1 Encounters:  08/11/21 5\' 7"  (1.702 m)    Weight:   Wt Readings from Last 1 Encounters:  08/11/21 48.6 kg     BMI:  Body mass index is 16.78 kg/m.  Estimated Nutritional Needs:  Kcal:  1700-1900 Protein:  85-100g Fluid:  >/=1.7L     Jarome Matin, MS, RD, LDN Registered Dietitian II Inpatient Clinical Nutrition RD pager # and on-call/weekend pager # available in [place]

## 2021-08-13 NOTE — Progress Notes (Signed)
[name]  Alex [name] 71 y.o. 14-Sep-1950   Subjective: Patient seen and examined laying in bed.  Patient notes he had a rough night and woke up with left-sided chest pain in the morning.  Notes his pain is improved slightly.  Able to tolerate clear liquids has not tried full liquids.  ROS : Review of Systems  Cardiovascular:  Positive for chest pain.  Gastrointestinal:  Negative for abdominal pain, blood in stool, constipation, diarrhea, heartburn, melena, nausea and vomiting.  Genitourinary:  Negative for dysuria and urgency.     Objective: Vital signs in last 24 hours: Vitals:   08/13/21 0527 08/13/21 0808  BP: 133/77   Pulse: 77   Resp: 16   Temp: 97.9 F (36.6 C)   SpO2: 96% 92%    Physical Exam:  General:  Alert, cooperative, no distress, appears stated age, ill-appearing  Head:  Normocephalic, without obvious abnormality, atraumatic  Eyes:  Anicteric sclera, EOM's intact  Lungs:   Clear to auscultation bilaterally, respirations unlabored  Heart:  Regular rate and rhythm, S1, S2 normal  Abdomen:   Soft, non-tender, bowel sounds active all four quadrants,  no masses,   Extremities: Extremities normal, atraumatic, no  edema  Pulses: 2+ and symmetric    Lab Results: Recent Labs    08/11/21 0841 08/12/21 0330  NA 136 136  K 3.3* 4.5  CL 99 102  CO2 28 28  GLUCOSE 106* 141*  BUN 14 16  CREATININE 0.74 0.70  CALCIUM 8.8* 8.6*   Recent Labs    08/11/21 0841  AST 22  ALT 22  ALKPHOS 83  BILITOT 0.9  PROT 6.6  ALBUMIN 3.1*   Recent Labs    08/11/21 0841 08/12/21 0330  WBC 14.8* 12.2*  HGB 13.5 12.1*  HCT 40.6 37.1*  MCV 89.0 89.2  PLT 287 250   Recent Labs    08/11/21 0841  LABPROT 15.6*  INR 1.3*      Assessment Dysphagia Abnormal CT scan Weight loss   EGD with dilation 08/11/2021 - The esophageal examination was consistent with achalasia. Dilated. - Small hiatal hernia. - The examination was  otherwise normal. - No specimens collected.   CT abdomen pelvis with contrast 08/05/2021 Constellation of findings is concerning for primary bronchogenic carcinoma with mediastinal invasion.  Necrotic tumor also extends into the upper abdomen through the diaphragmatic hiatus and abuts the celiac axis and left renal vein.   Findings of EGD significant for achalasia.  Patient was dilated.  We will evaluate for ability to tolerate diet or need for further intervention.     Plan: Continue supportive care Encourage patient to trial clear liquid diet if tolerated consider advancing diet. Continue MiraLAX twice daily for constipation.\ Continue Protonix 40 mg daily. [facility] will follow.  Arvella Nigh Skarleth Delmonico PA-C 08/13/2021, 11:38 AM  Contact #  [phone]

## 2021-08-14 ENCOUNTER — Inpatient Hospital Stay (HOSPITAL_COMMUNITY): Payer: Medicare Other

## 2021-08-14 DIAGNOSIS — K5903 Drug induced constipation: Secondary | ICD-10-CM

## 2021-08-14 DIAGNOSIS — F419 Anxiety disorder, unspecified: Secondary | ICD-10-CM | POA: Diagnosis not present

## 2021-08-14 DIAGNOSIS — K5900 Constipation, unspecified: Secondary | ICD-10-CM

## 2021-08-14 DIAGNOSIS — J449 Chronic obstructive pulmonary disease, unspecified: Secondary | ICD-10-CM | POA: Diagnosis not present

## 2021-08-14 DIAGNOSIS — C3492 Malignant neoplasm of unspecified part of left bronchus or lung: Secondary | ICD-10-CM | POA: Diagnosis not present

## 2021-08-14 DIAGNOSIS — I25118 Atherosclerotic heart disease of native coronary artery with other forms of angina pectoris: Secondary | ICD-10-CM | POA: Diagnosis not present

## 2021-08-14 MED ORDER — OXYCODONE HCL 5 MG PO TABS
5.0000 mg | ORAL_TABLET | ORAL | Status: DC | PRN
  Administered 2021-08-14 – 2021-08-15 (×2): 5 mg via ORAL
  Filled 2021-08-14 (×2): qty 1

## 2021-08-14 MED ORDER — OXYCODONE HCL ER 15 MG PO T12A
15.0000 mg | EXTENDED_RELEASE_TABLET | Freq: Two times a day (BID) | ORAL | Status: DC
  Administered 2021-08-14 – 2021-08-15 (×3): 15 mg via ORAL
  Filled 2021-08-14 (×3): qty 1

## 2021-08-14 MED ORDER — ACETAMINOPHEN 500 MG PO TABS
1000.0000 mg | ORAL_TABLET | Freq: Three times a day (TID) | ORAL | Status: DC
  Administered 2021-08-14 – 2021-08-15 (×3): 1000 mg via ORAL
  Filled 2021-08-14 (×3): qty 2

## 2021-08-14 NOTE — Progress Notes (Signed)
[facility] Progress Note  Alex [name] 71 y.o. 04/25/1950  Subjective: Patient seen and examined.  Patient is clutching left chest with pain.  States he does not have an appetite but understands he needs to eat or have better solution for malnutrition before he can leave the hospital.  Patient states he feels like he is coming in and out of lucidity.  Objective: Vital signs in last 24 hours: Vitals:   08/14/21 0553 08/14/21 0750  BP: 121/69   Pulse: 94   Resp: 18   Temp: 98.6 F (37 C)   SpO2: 93% 92%    Physical Exam:  General:  Alert, cooperative, no distress, appears stated age  Head:  Normocephalic, without obvious abnormality, atraumatic  Eyes:  Anicteric sclera, EOM's intact  Abdomen:   Soft, non-tender, bowel sounds active all four quadrants,  no masses,   Extremities: Extremities normal, atraumatic, no  edema  Pulses: 2+ and symmetric    Lab Results: Recent Labs    08/12/21 0330  NA 136  K 4.5  CL 102  CO2 28  GLUCOSE 141*  BUN 16  CREATININE 0.70  CALCIUM 8.6*   No results for input(s): AST, ALT, ALKPHOS, BILITOT, PROT, ALBUMIN in the last 72 hours. Recent Labs    08/12/21 0330  WBC 12.2*  HGB 12.1*  HCT 37.1*  MCV 89.2  PLT 250   No results for input(s): LABPROT, INR in the last 72 hours.    Assessment Dysphagia Abnormal CT scan Weight loss   EGD with dilation 08/11/2021 - The esophageal examination was consistent with achalasia. Dilated. - Small hiatal hernia. - The examination was otherwise normal. - No specimens collected.   CT abdomen pelvis with contrast 08/05/2021 Constellation of findings is concerning for primary bronchogenic carcinoma with mediastinal invasion.  Necrotic tumor also extends into the upper abdomen through the diaphragmatic hiatus and abuts the celiac axis and left renal vein.   Findings of EGD significant for achalasia.  Patient was dilated.  We will evaluate for ability to tolerate diet or need for  further intervention.   Patient has been discussed with dietitian to try high calorie shakes malnutrition.  Patient does not have appetite.   Plan: Continue supportive care Continue MiraLAX twice daily for constipation. Continue Protonix 40 mg daily. Encourage patient to attempt p.o. intake with high-calorie shake No Further GI recommendations at this time.  Arvella Nigh Raney Koeppen PA-C 08/14/2021, 10:41 AM  Contact #  [phone]

## 2021-08-14 NOTE — Progress Notes (Signed)
PROGRESS NOTE  Alex [name] HDQ:[id number] DOB: 27-Nov-1950   PCP: Alroy Dust, L.Marlou Sa, MD  Patient is from: Home.  Lives with his wife.  DOA: 08/05/2021 LOS: 9  Chief complaints Chief Complaint  Patient presents with   Chest Pain     Brief Narrative / Interim history: 71 year old M with PMH of CAD/CABG in 02/2021 presenting with lingering left chest pain for some time for which he was evaluated outpatient by cardiologist including PET scan that showed pleural effusion and consolidation, and he underwent thoracocentesis 2 days prior to admission.   CT angio chest/abdomen/pelvis showed lung mass concerning for bronchogenic carcinoma with metastasis.  Underwent bronchoscopy on 5/22.  Preliminary report concerning for NSCLC, and oncology consulted..    Patient also underwent endoscopy that showed achalasia for which she had dilation.  Advancing diet slowly.   Could be discharged home on 08/15/2021 if pain controlled on p.o. medications.   Subjective: Seen and examined earlier this morning.  No major events overnight or this morning.  Continues to endorse significant left-sided chest pain.  He rates his pain 9/10 although he does not appear to be in that much distress.  He is not a great historian either.  Tolerating full liquid diet.  He denies nausea or vomiting.  Objective: Vitals:   08/13/21 2246 08/14/21 0553 08/14/21 0750 08/14/21 1209  BP: 121/69 121/69  117/74  Pulse: 85 94  90  Resp: 20 18  16   Temp: 98.2 F (36.8 C) 98.6 F (37 C)  (!) 97.4 F (36.3 C)  TempSrc: Oral Oral    SpO2: 93% 93% 92% 94%  Weight:      Height:        Examination:  GENERAL: Appears frail. HEENT: MMM.  Vision and hearing grossly intact.  NECK: Supple.  No apparent JVD.  RESP:  No IWOB.  Fair aeration bilaterally. CVS:  RRR. Heart sounds normal.  ABD/GI/GU: BS+. Abd soft, NTND.  MSK/EXT:  Moves extremities.  Significant muscle mass and subcu fat loss. SKIN: no apparent skin lesion or  wound NEURO: Awake and alert. Oriented appropriately.  No apparent focal neuro deficit. PSYCH: Calm. Normal affect.   Procedures:  5/22-bronchoscopy and EGD  Microbiology summarized: Blood cultures NGTD. Fluid culture from bronchoscopy NGTD AFB smear and fungal culture pending  Assessment and Plan: * Metastatic adenocarcinoma of the lung S/p bronchoscopy on 5/22.  Pathology concerning for metastatic adenocarcinoma of the lung.  Concern about extension into abdomen and pleura.  MRI brain negative. -Appreciate input by oncology-added genetic testing to sample -Scheduled OxyContin and Tylenol with as needed oxycodone for pain control. -Discontinued IV morphine.  Esophageal dysphagia EGD showed achalasia.  He underwent dilation on 5/22.  Tolerated full liquid diet. -Advance to soft diet. -Advised to stay upright for meals and washing it down with drinks.  Left-sided chest pain/cancer related pain Likely from malignancy but difficult to rule out superimposed pneumonia.  Completed antibiotic course with ceftriaxone and azithromycin for 5 days.  -Scheduled OxyContin and Tylenol with as needed oxycodone -Discontinued IV morphine. -Scheduled MiraLAX and Senokot-S with as needed Fleet enema  Protein-calorie malnutrition, severe Nutrition Status: Nutrition Problem: Severe Malnutrition Etiology: chronic illness (CAD, aortic atherosclerosis) Signs/Symptoms: percent weight loss, severe fat depletion, severe muscle depletion Percent weight loss: 19 % Interventions: Boost Breeze, MVI, Magic cup, Refer to RD note for recommendations  Constipation Likely due to poor p.o. intake and opiate.  KUB reassuring. 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  Oral candidiasis Continue nystatin  Anxiety disorder Continue Klonopin  CAD s/p stent in 12/22 -Back on DAPT after  bronchoscopy and EGD. -Continue statin   DVT prophylaxis:  SCDs Start: 08/05/21 1929  Code Status: Full code Family Communication: Updated patient's wife at bedside. Level of care: Med-Surg.  Status is: Inpatient Remains inpatient appropriate because: Pain control and p.o. tolerance   Final disposition: Home with home health and DME on 5/26 if pain controlled on oral medications. Consultants:  Pulmonology Gastroenterology Oncology  Sch Meds:  Scheduled Meds:  acetaminophen  1,000 mg Oral Q8H   atorvastatin  80 mg Oral Daily   clopidogrel  75 mg Oral Daily   diltiazem  120 mg Oral Daily   feeding supplement  1 Container Oral TID BM   feeding supplement  237 mL Oral BID BM   fluticasone furoate-vilanterol  1 puff Inhalation Daily   And   umeclidinium bromide  1 puff Inhalation Daily   mouth rinse  15 mL Mouth Rinse BID   multivitamin with minerals  1 tablet Oral Daily   nystatin  5 mL Oral QID   oxyCODONE  15 mg Oral Q12H   pantoprazole  40 mg Oral Daily   polyethylene glycol  17 g Oral BID   senna-docusate  1 tablet Oral BID   Vitamin D (Ergocalciferol)  50,000 Units Oral Q7 days   Continuous Infusions:   PRN Meds:.albuterol, clonazePAM, nitroGLYCERIN, ondansetron (ZOFRAN) IV, oxyCODONE, sodium phosphate  Antimicrobials: Anti-infectives (From admission, onward)    Start     Dose/Rate Route Frequency Ordered Stop   08/08/21 1400  fluconazole (DIFLUCAN) IVPB 100 mg  Status:  Discontinued        100 mg 50 mL/hr over 60 Minutes Intravenous Every 24 hours 08/08/21 1236 08/12/21 1012   08/06/21 1300  azithromycin (ZITHROMAX) 500 mg in sodium chloride 0.9 % 250 mL IVPB  Status:  Discontinued        500 mg 250 mL/hr over 60 Minutes Intravenous Every 24 hours 08/05/21 1931 08/11/21 0959   08/06/21 1200  cefTRIAXone (ROCEPHIN) 2 g in sodium chloride 0.9 % 100 mL IVPB        2 g 200 mL/hr over 30 Minutes Intravenous Every 24 hours 08/05/21 1931 08/12/21 1229   08/05/21  1200  cefTRIAXone (ROCEPHIN) 1 g in sodium chloride 0.9 % 100 mL IVPB        1 g 200 mL/hr over 30 Minutes Intravenous  Once 08/05/21 1156 08/05/21 1314   08/05/21 1200  azithromycin (ZITHROMAX) 500 mg in sodium chloride 0.9 % 250 mL IVPB        500 mg 250 mL/hr over 60 Minutes Intravenous  Once 08/05/21 1156 08/05/21 1425        I have personally reviewed the following labs and images: CBC: Recent Labs  Lab 08/08/21 0412 08/08/21 1149 08/09/21 0819 08/11/21 0841 08/12/21 0330  WBC 14.6* 15.5* 14.6* 14.8* 12.2*  HGB 13.4 13.8 13.3 13.5 12.1*  HCT 40.7 40.9 41.1 40.6 37.1*  MCV 90.0 88.7 90.3 89.0 89.2  PLT 281 273 281 287 250   BMP &GFR Recent Labs  Lab 08/08/21 0412 08/08/21 1149 08/09/21 0819 08/11/21 0841 08/12/21 0330  NA 135 139 139 136 136  K 3.4* 3.4* 4.4 3.3* 4.5  CL 97* 98 101 99 102  CO2 28 29 30 28 28   GLUCOSE 127* 124* 107* 106* 141*  BUN 22 23 24*  14 16  CREATININE 0.95 0.86 0.75 0.74 0.70  CALCIUM 9.0 9.5 9.3 8.8* 8.6*   Estimated Creatinine Clearance: 57.4 mL/min (by C-G formula based on SCr of 0.7 mg/dL). Liver & Pancreas: Recent Labs  Lab 08/11/21 0841  AST 22  ALT 22  ALKPHOS 83  BILITOT 0.9  PROT 6.6  ALBUMIN 3.1*   No results for input(s): LIPASE, AMYLASE in the last 168 hours. No results for input(s): AMMONIA in the last 168 hours. Diabetic: No results for input(s): HGBA1C in the last 72 hours. No results for input(s): GLUCAP in the last 168 hours. Cardiac Enzymes: No results for input(s): CKTOTAL, CKMB, CKMBINDEX, TROPONINI in the last 168 hours. No results for input(s): PROBNP in the last 8760 hours. Coagulation Profile: Recent Labs  Lab 08/11/21 0841  INR 1.3*   Thyroid Function Tests: No results for input(s): TSH, T4TOTAL, FREET4, T3FREE, THYROIDAB in the last 72 hours. Lipid Profile: No results for input(s): CHOL, HDL, LDLCALC, TRIG, CHOLHDL, LDLDIRECT in the last 72 hours. Anemia Panel: No results for input(s):  VITAMINB12, FOLATE, FERRITIN, TIBC, IRON, RETICCTPCT in the last 72 hours. Urine analysis:    Component Value Date/Time   COLORURINE YELLOW 07/01/2009 1234   APPEARANCEUR CLEAR 07/01/2009 1234   LABSPEC 1.017 07/01/2009 1234   PHURINE 7.5 07/01/2009 1234   GLUCOSEU NEGATIVE 07/01/2009 1234   HGBUR NEGATIVE 07/01/2009 1234   BILIRUBINUR NEGATIVE 07/01/2009 1234   KETONESUR NEGATIVE 07/01/2009 1234   PROTEINUR NEGATIVE 07/01/2009 1234   UROBILINOGEN 1.0 07/01/2009 1234   NITRITE NEGATIVE 07/01/2009 1234   LEUKOCYTESUR  07/01/2009 1234    NEGATIVE MICROSCOPIC NOT DONE ON URINES WITH NEGATIVE PROTEIN, BLOOD, LEUKOCYTES, NITRITE, OR GLUCOSE <1000 mg/dL.   Sepsis Labs: Invalid input(s): PROCALCITONIN, [place]  Microbiology: Recent Results (from the past 240 hour(s))  Blood culture (routine x 2)     Status: None   Collection Time: 08/05/21  9:28 AM   Specimen: Right Antecubital; Blood  Result Value Ref Range Status   Specimen Description   Final    RIGHT ANTECUBITAL Performed at [facility], [address], [place], [place] [address]    Special Requests   Final    BOTTLES DRAWN AEROBIC AND ANAEROBIC Blood Culture results may not be optimal due to an inadequate volume of blood received in culture bottles Performed at [facility], [address] [place], [place] [address]    Culture   Final    NO GROWTH 5 DAYS Performed at [facility], [place] [address], [place], [place] [address]    Report Status 08/10/2021 FINAL  Final  Blood culture (routine x 2)     Status: None   Collection Time: 08/05/21  9:29 AM   Specimen: BLOOD LEFT FOREARM  Result Value Ref Range Status   Specimen Description   Final    BLOOD LEFT FOREARM Performed at [facility], [address], [place], [place] [address]    Special Requests   Final    BOTTLES DRAWN AEROBIC AND ANAEROBIC Blood Culture adequate volume Performed at [facility], [address] [place], [place] [address]    Culture   Final    NO GROWTH 5 DAYS Performed at [facility], [place] [address], [place], [place] [address]    Report Status 08/10/2021 FINAL  Final  Acid Fast Smear (AFB)     Status: None   Collection Time: 08/11/21 12:55 PM   Specimen: PATH Cytology FNA; Body Fluid  Result Value Ref Range Status   AFB Specimen Processing Concentration  Final   Acid Fast Smear Comment  Final    Comment: (NOTE) Testing is temporarily delayed pending receipt of testing materials from the manufacturer. We apologize for any inconvenience. Performed At: [facility] [place], [place] [id number] Rush Farmer MD GD:[id number]    Source (AFB) FLUID  Final    Comment: Performed at [facility], [place] [address], [place], [place] [address]  Anaerobic culture w Gram Stain     Status: None (Preliminary result)   Collection Time: 08/11/21 12:55 PM   Specimen: PATH Cytology FNA; Body Fluid  Result Value Ref Range Status   Specimen Description   Final    FLUID Performed at [facility] [address] [place], [place] [address]    Special Requests ESOPHAGOGASTRODUODENOSCOPY BODY FLUID  Final   Gram Stain   Final    FEW WBC PRESENT, PREDOMINANTLY MONONUCLEAR NO ORGANISMS SEEN Performed at [facility], [place] [address] [place], [place] [address]    Culture   Final    NO ANAEROBES ISOLATED; CULTURE IN PROGRESS FOR 5 DAYS   Report Status PENDING  Incomplete  Body fluid culture w Gram Stain     Status: None (Preliminary result)   Collection Time: 08/11/21 12:55 PM   Specimen: PATH Cytology FNA; Body Fluid  Result Value Ref Range Status   Specimen Description   Final    FLUID Performed at [facility] [address], [place], [place] [address]    Special Requests ESOPHAGOGASTRODUODENOSCOPY BODY FLUID  Final   Gram Stain   Final    FEW WBC  PRESENT, PREDOMINANTLY MONONUCLEAR NO ORGANISMS SEEN    Culture   Final    NO GROWTH 3 DAYS Performed at [facility], [place] [address], [place],  [address]    Report Status PENDING  Incomplete    Radiology Studies: DG Abd Portable 1V  Result Date: 08/14/2021 CLINICAL DATA:  Abdominal distension in a 71 year old male. EXAM: PORTABLE ABDOMEN - 1 VIEW COMPARISON:  Aug 08, 2021. FINDINGS: Dense LEFT lower lobe consolidation and signs of LEFT-sided pleural effusion. Diminished stool burden perhaps compared to previous imaging still with moderate stool in the colon. No signs of small bowel dilation. On limited assessment no acute regional skeletal process. IMPRESSION: 1. Slightly diminished stool burden perhaps compared to previous imaging still with moderate stool in the colon. No signs of bowel obstruction. 2. Dense LEFT lower lobe consolidation with suspected increased pleural fluid in this patient with potential or suspected LEFT lower lobe mass may reflect worsening consolidation superimposed on known masslike area. Electronically Signed   By: Zetta Bills M.D.   On: 08/14/2021 10:48      Raigen Jagielski T. [place]  If 7PM-7AM, please contact night-coverage [email or web address] 08/14/2021, 2:52 PM

## 2021-08-14 NOTE — Progress Notes (Signed)
Physical Therapy Treatment Patient Details Name: Alex [name] MRN: [id number] DOB: 06-28-1950 Today's Date: 08/14/2021   History of Present Illness Patient is 71 y.o. male admitted with chest pain CTA concerning for new lung mass. He is a former smoker had PCI with DAPT in December 2022.  He had persistent weight loss, dysphagia, and chest pain.  He had cardiac PET scan on 07/23/21.  This showed Lt lower lobe airspace disease and Lt pleural effusion, pleural plaques, emphysema, and 4 mm nodule in Rt lung.  He was treated with doxycycline.  Lt thoracentesis done with IR on 08/04/21 with 550 ml fluid removed.  He had progressive chest pain and dyspnea after procedure and presented to the ER and was admitted to hospital.  CT chest/abd/pelvis from 08/05/21 showed: "Consolidation of medial LLL with area of cavitation.  Adjacent to and inseparable from the medial left lower lobe consolidation is a 6.0 x 4.3 x 9.6 cm area low-density mass with thickened, irregular enhancing margins, abutting and inseparable from the descending thoracic aorta. This abnormal soft tissue contacts the distal esophagus as well and extends through the diaphragmatic hiatus to abut the celiac axis and left renal vein. Rt kidney nodule notes as well. PMH significant for  CAD S/P PCI with DAPT started 02/2021, GERD, HLD, anxiety, and arthritis, Pectus excavatum.    PT Comments    General Comments: AxO x 3 very pleasant but in a lot pain this session.  General bed mobility comments: self able, supervision for safety, pt taking extra time.  General transfer comment: pt self able to rise with increased time and good use B UE's to self assist.  poor forward flex posture.  Increased left posterior flank pain with activity. General Gait Details: used the walker this session "just for safety" as pt plans NOT to use in his home but admittingly "furniture" walks.  Amb distance limited to 10 feet due to increased pain in his LEFT back.  "took two  Tylenol" earlier.  Recommendations for follow up therapy are one component of a multi-disciplinary discharge planning process, led by the attending physician.  Recommendations may be updated based on patient status, additional functional criteria and insurance authorization.  Follow Up Recommendations  Home health PT     Assistance Recommended at Discharge Intermittent Supervision/Assistance  Patient can return home with the following A little help with walking and/or transfers;A little help with bathing/dressing/bathroom;Assistance with cooking/housework;Direct supervision/assist for medications management;Assist for transportation;Help with stairs or ramp for entrance   Equipment Recommendations  None recommended by PT    Recommendations for Other Services       Precautions / Restrictions Precautions Precautions: Fall     Mobility  Bed Mobility Overal bed mobility: Needs Assistance Bed Mobility: Supine to Sit, Sit to Supine     Supine to sit: Supervision Sit to supine: Supervision   General bed mobility comments: self able, supervision for safety, pt taking extra time    Transfers Overall transfer level: Needs assistance Equipment used: None Transfers: Sit to/from Stand Sit to Stand: Supervision, Min guard           General transfer comment: pt self able to rise with increased time and good use B UE's to self assist.  poor forward flex posture.  Increased left posterior flank pain with activity.    Ambulation/Gait Ambulation/Gait assistance: Supervision, Min guard Gait Distance (Feet): 10 Feet Assistive device: Rolling walker (2 wheels) Gait Pattern/deviations: Step-through pattern, Decreased stride length, Shuffle, Drifts right/left, Trunk flexed Gait  velocity: decreased     General Gait Details: used the walker this session "just for safety" as pt plans NOT to use in his home but admittingly "furniture" walks.  Amb distance limited to 10 feet due to increased  pain in his LEFT back.  "took two Tylenol" earlier.   Stairs             Wheelchair Mobility    Modified Rankin (Stroke Patients Only)       Balance                                            Cognition Arousal/Alertness: Awake/alert Behavior During Therapy: WFL for tasks assessed/performed Overall Cognitive Status: Within Functional Limits for tasks assessed                                 General Comments: AxO x 3 very pleasant but in a lot pain this session        Exercises      General Comments        Pertinent Vitals/Pain Pain Assessment Pain Assessment: Faces Faces Pain Scale: Hurts even more Pain Location: Left posterior flank near kidney Pain Descriptors / Indicators: Aching, Discomfort, Sore, Guarding Pain Intervention(s): Monitored during session    Home Living                          Prior Function            PT Goals (current goals can now be found in the care plan section) Progress towards PT goals: Progressing toward goals    Frequency    Min 3X/week      PT Plan Current plan remains appropriate    Co-evaluation              AM-PAC PT "6 Clicks" Mobility   Outcome Measure  Help needed turning from your back to your side while in a flat bed without using bedrails?: A Little Help needed moving from lying on your back to sitting on the side of a flat bed without using bedrails?: A Little Help needed moving to and from a bed to a chair (including a wheelchair)?: A Little Help needed standing up from a chair using your arms (e.g., wheelchair or bedside chair)?: A Little Help needed to walk in hospital room?: A Little Help needed climbing 3-5 steps with a railing? : A Little 6 Click Score: 18    End of Session Equipment Utilized During Treatment: Gait belt Activity Tolerance: Patient limited by pain Patient left: in bed;with bed alarm set;with call bell/phone within reach Nurse  Communication: Mobility status PT Visit Diagnosis: Muscle weakness (generalized) (M62.81);Difficulty in walking, not elsewhere classified (R26.2);Unsteadiness on feet (R26.81);Other symptoms and signs involving the nervous system (R29.898)     Time: 1458-1510 PT Time Calculation (min) (ACUTE ONLY): 12 min  Charges:  $Gait Training: 8-22 mins                     Rica Koyanagi  PTA Acute  Rehabilitation Services Pager      [phone] Office      [phone]

## 2021-08-14 NOTE — Assessment & Plan Note (Addendum)
Likely due to poor p.o. intake and opiate.  Resolved. -Bowel regimen with MiraLAX and Senokot-S

## 2021-08-14 NOTE — Plan of Care (Signed)

## 2021-08-14 NOTE — Plan of Care (Signed)

## 2021-08-14 NOTE — TOC Progression Note (Signed)
Transition of Care [facility]) - Progression Note    Patient Details  Name: Alex [name] MRN: [id number] Date of Birth: Jul 30, 1950  [facility] CM/SW Contact  Leeroy Cha, RN Phone Number: 08/14/2021, 7:35 AM  Clinical Narrative:    Following for toc needs   Expected Discharge Plan: Home/Self Care Barriers to Discharge: No Barriers Identified  Expected Discharge Plan and Services Expected Discharge Plan: Home/Self Care     Post Acute Care Choice: Durable Medical Equipment, Home Health Living arrangements for the past 2 months: Single Family Home                                       Social Determinants of Health (SDOH) Interventions    Readmission Risk Interventions     View : No data to display.

## 2021-08-15 ENCOUNTER — Other Ambulatory Visit (HOSPITAL_BASED_OUTPATIENT_CLINIC_OR_DEPARTMENT_OTHER): Payer: Self-pay

## 2021-08-15 ENCOUNTER — Other Ambulatory Visit (HOSPITAL_COMMUNITY): Payer: Self-pay

## 2021-08-15 DIAGNOSIS — J9859 Other diseases of mediastinum, not elsewhere classified: Secondary | ICD-10-CM

## 2021-08-15 DIAGNOSIS — C3492 Malignant neoplasm of unspecified part of left bronchus or lung: Secondary | ICD-10-CM | POA: Diagnosis not present

## 2021-08-15 DIAGNOSIS — K5903 Drug induced constipation: Secondary | ICD-10-CM | POA: Diagnosis not present

## 2021-08-15 DIAGNOSIS — I25118 Atherosclerotic heart disease of native coronary artery with other forms of angina pectoris: Secondary | ICD-10-CM | POA: Diagnosis not present

## 2021-08-15 DIAGNOSIS — J449 Chronic obstructive pulmonary disease, unspecified: Secondary | ICD-10-CM | POA: Diagnosis not present

## 2021-08-15 LAB — ACID FAST SMEAR (AFB, MYCOBACTERIA): Acid Fast Smear: NEGATIVE

## 2021-08-15 LAB — BODY FLUID CULTURE W GRAM STAIN: Culture: NO GROWTH

## 2021-08-15 MED ORDER — MORPHINE SULFATE (PF) 2 MG/ML IV SOLN
1.0000 mg | Freq: Once | INTRAVENOUS | Status: AC | PRN
  Administered 2021-08-15: 1 mg via INTRAVENOUS
  Filled 2021-08-15: qty 1

## 2021-08-15 MED ORDER — VITAMIN D (ERGOCALCIFEROL) 1.25 MG (50000 UNIT) PO CAPS
50000.0000 [IU] | ORAL_CAPSULE | ORAL | 0 refills | Status: AC
Start: 2021-08-15 — End: 2021-09-27

## 2021-08-15 MED ORDER — ENSURE ENLIVE PO LIQD: 237.0000 mL | Freq: Two times a day (BID) | ORAL | 0 refills | Status: AC

## 2021-08-15 MED ORDER — POLYETHYLENE GLYCOL 3350 17 GM/SCOOP PO POWD
17.0000 g | Freq: Two times a day (BID) | ORAL | 0 refills | Status: DC | PRN
  Filled 2021-08-15: qty 255, 8d supply, fill #0
  Filled 2021-08-15: qty 238, 7d supply, fill #0

## 2021-08-15 MED ORDER — VITAMIN D (ERGOCALCIFEROL) 1.25 MG (50000 UNIT) PO CAPS
50000.0000 [IU] | ORAL_CAPSULE | ORAL | 0 refills | Status: DC
  Filled 2021-08-15 (×2): qty 7, 49d supply, fill #0

## 2021-08-15 MED ORDER — ADULT MULTIVITAMIN W/MINERALS CH: 1.0000 | ORAL_TABLET | Freq: Every day | ORAL | 1 refills | Status: DC

## 2021-08-15 MED ORDER — ENSURE ENLIVE PO LIQD
237.0000 mL | Freq: Two times a day (BID) | ORAL | 0 refills | Status: DC
  Filled 2021-08-15: qty 14220, 30d supply, fill #0

## 2021-08-15 MED ORDER — SENNOSIDES-DOCUSATE SODIUM 8.6-50 MG PO TABS: 1.0000 | ORAL_TABLET | Freq: Two times a day (BID) | ORAL | 0 refills | Status: DC | PRN

## 2021-08-15 MED ORDER — HYDROCODONE-ACETAMINOPHEN 5-325 MG PO TABS: 1.0000 | ORAL_TABLET | Freq: Four times a day (QID) | ORAL | 0 refills | Status: AC | PRN

## 2021-08-15 NOTE — Progress Notes (Signed)
AVS and discharge instructions reviewed w/ patient and wife at the bedside. Patient and wife verbalized understanding and had no further questions. Patient refused fleet enema before discharge but patient did have a small BM yesterday after not having one for about 5-6 days. Provided education to patient and wife on using OTC laxatives and enemas at home when needed.

## 2021-08-15 NOTE — TOC Progression Note (Addendum)
Transition of Care [facility]) - Progression Note    Patient Details  Name: Alex [name] MRN: [id number] Date of Birth: 12-26-1950  Transition of Care [facility]) CM/SW Contact  Leeroy Cha, RN Phone Number: 08/15/2021, 7:54 AM  Clinical Narrative:    Request for home p.t. sent to [facility] at 0753. [facility] UNABLE TO SEE FOR THERAPY Sent to [facility] for review. [facility] unable to see Sent to [facility] unable to take Tct cindy with [facility] message left with information and call back number [facility] can not see due to insurance Faxed to intake at [employer]. Expected Discharge Plan: Home/Self Care Barriers to Discharge: No Barriers Identified  Expected Discharge Plan and Services Expected Discharge Plan: Home/Self Care     Post Acute Care Choice: Durable Medical Equipment, Home Health Living arrangements for the past 2 months: Single Family Home                           HH Arranged: PT [place]: [place] ([place]) Date [place]: 08/15/21 Time [place]: [phone] Representative spoke with at [place]: [place] ([place]) Interventions    Readmission Risk Interventions     View : No data to display.

## 2021-08-15 NOTE — Discharge Summary (Signed)
Physician Discharge Summary  Alex [name] YIR:[id number] DOB: June 18, 1950 DOA: 08/05/2021  PCP: Alroy Dust, L.Marlou Sa, MD  Admit date: 08/05/2021 Discharge date: 08/15/2021 Admitted From: Home Disposition: Home Recommendations for Outpatient Follow-up:  Follow ups as below. Oncology to arrange outpatient follow-up. Please obtain CBC/BMP/Mag at follow up Please follow up on the following pending results: Genetic testing on bronchoscopic tissue sample  Home Health: PT/OT Equipment/Devices: None  Discharge Condition: Stable but guarded prognosis CODE STATUS: Full code  Follow-up Information     Alroy Dust, L.Marlou Sa, MD. Schedule an appointment as soon as possible for a visit in 1 week(s).   Specialty: Family Medicine Contact information: [address] [place] [address] [phone]                 Hospital course 71 year old M with PMH of CAD/CABG in 02/2021 presenting with lingering left chest pain for some time for which he was evaluated outpatient by cardiologist including PET scan that showed pleural effusion and consolidation, and he underwent thoracocentesis 2 days prior to admission.   CT angio chest/abdomen/pelvis showed lung mass concerning for bronchogenic carcinoma with metastasis.  Underwent bronchoscopy on 5/22.  Preliminary report concerning for adenocarcinoma of the lung, and oncology consulted, and added genetic testing.  Oncology to arrange outpatient follow-up to discuss treatment options.  Patient also underwent endoscopy that showed achalasia for which she had dilation.  Diet advanced and he tolerated soft diet prior to discharge.   See individual problem list below for more on hospital course.  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  Pathology concerning for metastatic adenocarcinoma of the lung.  Concern about extension into abdomen and pleura.  MRI brain negative. -Oncology to arrange outpatient follow-up after genetic testing result -Prescribed Norco for pain control -Bowel regimens  Esophageal dysphagia EGD showed achalasia.  He underwent dilation on 5/22.  Tolerated soft diet prior to discharge.  Left-sided chest pain/cancer related pain Likely from malignancy but difficult to rule out superimposed pneumonia.  Completed antibiotic course with ceftriaxone and azithromycin for 5 days.  -Discharged with Norco and bowel regimen  Protein-calorie malnutrition, severe Nutrition Status: Nutrition Problem: Severe Malnutrition Etiology: chronic illness (CAD, aortic atherosclerosis) Signs/Symptoms: percent weight loss, severe fat depletion, severe muscle depletion Percent weight loss: 19 % Interventions: Boost Breeze, MVI, Magic cup, Refer to RD note for recommendations  Chronic bronchitis with COPD (chronic obstructive pulmonary disease) ([place]) Continue on Trelegy Ellipta with as needed albuterol  Anxiety disorder Continue Klonopin  CAD s/p stent in 12/22 Stable.  Continue DAPT and statin  Constipation-resolved as of 08/15/2021 Likely due to poor p.o. intake and opiate.  Resolved. -Bowel regimen with MiraLAX and Senokot-S  Hypokalemia-resolved as of 08/15/2021 Resolved.  Oral candidiasis-resolved as of 08/15/2021 Resolved.   Vital signs Vitals:   08/14/21 2024 08/15/21 0128 08/15/21 0513 08/15/21 0741  BP: 110/74 106/67 119/75   Pulse: 81 86 88   Temp: 98.1 F (36.7 C) 98.6 F (37 C) 97.7 F (36.5 C)   Resp: 16 16 20    Height:      Weight:      SpO2:  95% 95% 95% 95%  TempSrc: Oral Oral Oral   BMI (Calculated):         Discharge exam  GENERAL: Frail.  No apparent distress. HEENT: MMM.  Vision and hearing grossly intact.  NECK: Supple.  No apparent JVD.  RESP:  No IWOB.  Fair aeration bilaterally. CVS:  RRR. Heart sounds normal.  ABD/GI/GU: BS+. Abd soft, NTND.  MSK/EXT:  Moves extremities.  Significant muscle mass and subcu fat loss. SKIN: no apparent skin lesion or wound NEURO: Awake and alert. Oriented appropriately.  No apparent focal neuro deficit. PSYCH: Calm. Normal affect.   Discharge Instructions Discharge Instructions     Call MD for:  difficulty breathing, headache or visual disturbances   Complete by: As directed    Call MD for:  severe uncontrolled pain   Complete by: As directed    Diet full liquid   Complete by: As directed    May advance to mechanical soft diet as tolerated   Discharge instructions   Complete by: As directed    It has been a pleasure taking care of you!  You were hospitalized due to left-sided chest pain and difficulty swallowing.  Your chest pain is likely due to lung cancer and possible pneumonia.  You have been treated with antibiotic for possible pneumonia.  Please follow-up with oncology for further evaluation and management of lung cancer.  In regards to difficulty swallowing, we recommend continuing full liquid diet and slowly advancing to mechanically soft diet.  Please stay upright and have a glass of water during meals.   Take care,   Increase activity slowly   Complete by: As directed       Allergies as of 08/15/2021   No Known Allergies      Medication List     STOP taking these medications    ibuprofen 200 MG tablet Commonly known as: ADVIL       TAKE these medications    acetaminophen 500 MG tablet Commonly known as: TYLENOL Take 500 mg by mouth every 6 (six) hours as needed.   albuterol 108 (90 Base) MCG/ACT inhaler Commonly known as: VENTOLIN  HFA Inhale into the lungs every 6 (six) hours as needed for wheezing or shortness of breath.   Aspirin Low Dose 81 MG tablet Generic drug: aspirin EC Take 1 tablet (81 mg total) by mouth daily.   atorvastatin 80 MG tablet Commonly known as: Lipitor Take 1 tablet (80 mg total) by mouth daily.   clonazePAM 0.5 MG tablet Commonly known as: KLONOPIN Take 0.5 mg by mouth 2 (two) times daily as needed for anxiety.   clopidogrel 75 MG tablet Commonly known as: PLAVIX Take 1 tablet (75 mg total) by mouth daily with breakfast.   diltiazem 120 MG 24 hr capsule Commonly known as: CARDIZEM CD Take 1 capsule (120 mg total) by mouth daily.   feeding supplement Liqd Take 237 mLs by mouth 2 (two) times daily between meals.   HYDROcodone-acetaminophen 5-325 MG tablet Commonly known as: NORCO/VICODIN Take 1-2 tablets by mouth every 6 (six) hours as needed for up to 8 days for moderate pain or severe pain. What changed:  how much to take reasons to take this   multivitamin with minerals Tabs tablet Take 1 tablet by mouth daily.   nitroGLYCERIN 0.4 MG SL tablet Commonly known as: NITROSTAT Place 1 tablet (0.4 mg total) under the tongue every 5 (five) minutes as needed for chest pain.   pantoprazole 40 MG tablet Commonly known  as: Protonix Take 1 tablet (40 mg total) by mouth daily.   polyethylene glycol powder 17 GM/SCOOP powder Commonly known as: MiraLax Measure 17 g of powder, mix in 4 oz of juice or water and drink by mouth 2 (two) times daily as needed for moderate constipation.   senna-docusate 8.6-50 MG tablet Commonly known as: Senokot-S Take 1 tablet by mouth 2 (two) times daily between meals as needed for moderate constipation.   Trelegy Ellipta 100-62.5-25 MCG/ACT Aepb Generic drug: Fluticasone-Umeclidin-Vilant Inhale 1 puff into the lungs daily.   Vitamin D (Ergocalciferol) 1.25 MG (50000 UNIT) Caps capsule Commonly known as: DRISDOL Take 1 capsule (50,000 Units  total) by mouth every 7 (seven) days for 7 doses.        Consultations: Pulmonology Oncology  Procedures/Studies: 5/22-bronchoscopy and EGD with esophageal dilation   DG Chest 1 View  Result Date: 08/04/2021 CLINICAL DATA:  Post left thoracentesis.  550 cc removed. EXAM: CHEST  1 VIEW COMPARISON:  07/31/2021 and PET 07/22/2021. FINDINGS: Trachea is midline. Heart size normal. Small residual left pleural effusion status post left thoracentesis. Difficult to excluded a crescentic collection of loculated pleural air at the base of the left hemithorax. Left lower lobe opacification. Lungs are otherwise clear. IMPRESSION: 1. Difficult to exclude loculated pleural air at the base of the left hemithorax, status post left thoracentesis. Small residual left pleural effusion. 2. Left lower lobe subtotal consolidation may be due to pneumonia. Electronically Signed   By: Lorin Picket M.D.   On: 08/04/2021 10:22   DG Chest 2 View  Result Date: 08/05/2021 CLINICAL DATA:  Recent thoracentesis with 500 cc removed. LEFT-sided pain. EXAM: CHEST - 2 VIEW COMPARISON:  08/04/2021, 07/31/2021 FINDINGS: Normal cardiac silhouette. Lungs are hyperinflated. Trace LEFT effusion remains. No pneumothorax. LEFT basilar atelectasis. IMPRESSION: 1. No interval change from 1 day prior. 2. Trace LEFT effusion and LEFT basilar atelectasis. 3. No pneumothorax. Electronically Signed   By: Suzy Bouchard M.D.   On: 08/05/2021 09:52   DG Chest 2 View  Result Date: 08/03/2021 CLINICAL DATA:  Follow-up left pleural effusion. EXAM: CHEST - 2 VIEW COMPARISON:  July 08, 2021 FINDINGS: The heart size and mediastinal contours are within normal limits. Mild atelectasis and/or infiltrate is seen within the left lung base. There is a small left pleural effusion. This is mildly increased in size when compared to the prior exam. No pneumothorax is identified. The visualized skeletal structures are unremarkable. IMPRESSION: 1. Mild left  basilar atelectasis and/or infiltrate. Follow-up to resolution is recommended, as an underlying neoplastic process cannot be excluded. 2. Small left pleural effusion, mildly increased in size when compared to the prior exam. Electronically Signed   By: Virgina Norfolk M.D.   On: 08/03/2021 19:41   DG Abd 1 View  Result Date: 08/08/2021 CLINICAL DATA:  Constipation. EXAM: ABDOMEN - 1 VIEW COMPARISON:  CT abdomen and pelvis 08/05/2021 FINDINGS: The bowel gas pattern is nonobstructive with moderate diffuse retained stool in general with improved rectal fecal burden since the CT. No radio-opaque calculi or other significant radiographic abnormality are seen. There is no supine evidence of free air. There is aortoiliac and iliofemoral calcific disease. Degenerative change of the lower lumbar spine. IMPRESSION: Moderate constipation.  No dilated small bowel is seen. Electronically Signed   By: Telford Nab M.D.   On: 08/08/2021 06:16   CT Angio Chest PE W and/or Wo Contrast  Result Date: 08/05/2021 CLINICAL DATA:  Chest pain.  Left thoracentesis yesterday. EXAM: CT  ANGIOGRAPHY CHEST CT ABDOMEN AND PELVIS WITH CONTRAST TECHNIQUE: Multidetector CT imaging of the chest was performed using the standard protocol during bolus administration of intravenous contrast. Multiplanar CT image reconstructions and MIPs were obtained to evaluate the vascular anatomy. Multidetector CT imaging of the abdomen and pelvis was performed using the standard protocol during bolus administration of intravenous contrast. RADIATION DOSE REDUCTION: This exam was performed according to the departmental dose-optimization program which includes automated exposure control, adjustment of the mA and/or kV according to patient size and/or use of iterative reconstruction technique. CONTRAST:  71mL OMNIPAQUE IOHEXOL 350 MG/ML SOLN COMPARISON:  Chest x-ray from same day. Cardiac PET-CT dated Jul 22, 2021. FINDINGS: CTA CHEST FINDINGS Cardiovascular:  Satisfactory opacification of the pulmonary arteries to the segmental level. No evidence of pulmonary embolism. Normal heart size. No pericardial effusion. No thoracic aortic aneurysm or dissection. Coronary, aortic arch, and branch vessel atherosclerotic vascular disease. Mediastinum/Nodes: Mildly enlarged left hilar and ill-defined subcarinal lymph nodes measuring up to 1.4 cm in short axis. No enlarged axillary lymph nodes. The thyroid gland and esophagus demonstrate no significant findings. Lungs/Pleura: Trace secretions in the esophagus. Continued consolidation in the medial left lower lobe with heterogeneous enhancement and new small areas of cavitation. Adjacent to and inseparable from the medial left lower lobe consolidation is a 6.0 x 4.3 x 9.6 cm area low-density mass with thickened, irregular enhancing margins, abutting and inseparable from the descending thoracic aorta. This abnormal soft tissue contacts the distal esophagus as well and extends through the diaphragmatic hiatus to abut the celiac axis and left renal vein. Decreased small left pleural effusion with thin pleural enhancement. No pneumothorax. Bilateral calcified pleural plaques and moderate centrilobular emphysema again noted. 6 mm subpleural nodular density in the posterior right lower lobe was not present on the CT from 2 weeks ago and likely represents atelectasis. No follow-up imaging is recommended. Musculoskeletal: Pectus excavatum deformity again noted. No acute or significant osseous findings. Review of the MIP images confirms the above findings. CT ABDOMEN AND PELVIS FINDINGS Hepatobiliary: No focal liver abnormality is seen. No gallstones, gallbladder wall thickening, or biliary dilatation. Pancreas: Unremarkable. No pancreatic ductal dilatation or surrounding inflammatory changes. Spleen: Normal in size without focal abnormality. Adrenals/Urinary Tract: Adrenal glands are unremarkable. Kidneys are normal, without renal calculi,  focal lesion, or hydronephrosis. Bladder is unremarkable. Stomach/Bowel: Stomach is within normal limits. Appendix appears normal. No evidence of bowel wall thickening, distention, or inflammatory changes. Moderate amount of stool in the colon. Vascular/Lymphatic: Aortic atherosclerosis. Abnormal soft tissue density extending from the thorax through the diaphragmatic hiatus to abut the celiac axis and left renal vein (series 2, images 14-21; series 5, images 34-42)). 1.3 cm aortocaval necrotic lymph node versus tumor extension (series 2, image 23). Reproductive: Borderline enlarged prostate gland. Other: No abdominal wall hernia or abnormality. No abdominopelvic ascites. No pneumoperitoneum. 1.1 cm necrotic nodule posterior to the right kidney (series 2, image 36). Musculoskeletal: No acute or significant osseous findings. 1.7 cm well-defined lucent lesion with thin sclerotic rim in the L5 vertebral body, likely benign cyst. Review of the MIP images confirms the above findings. IMPRESSION: 1. No evidence of pulmonary embolism. 2. Continued heterogeneously enhancing consolidation in the medial left lower lobe with new small areas of cavitation. Adjacent to and inseparable from the consolidation is a 6.0 x 4.3 x 9.6 cm low-density mass with thickened, irregular enhancing margins, abutting and inseparable from the descending thoracic aorta and distal esophagus. Constellation of findings is concerning for primary bronchogenic  carcinoma with mediastinal invasion. There may be superimposed left lower lobe pneumonia as well. 3. Necrotic tumor also extends into the upper abdomen through the diaphragmatic hiatus and abuts the celiac axis and left renal vein. 1.3 cm aortocaval necrotic lymph node versus tumor extension. 1.1 cm necrotic nodule posterior to the right kidney. 4. Mildly enlarged left hilar and ill-defined subcarinal lymphadenopathy, concerning for nodal metastatic disease. 5. Decreased small left pleural effusion  with thin pleural enhancement, concerning for malignant effusion. 6. Aortic Atherosclerosis (ICD10-I70.0) and Emphysema (ICD10-J43.9). Electronically Signed   By: Titus Dubin M.D.   On: 08/05/2021 11:30   MR BRAIN W WO CONTRAST  Result Date: 08/12/2021 CLINICAL DATA:  Non-small cell lung cancer, staging EXAM: MRI HEAD WITHOUT AND WITH CONTRAST TECHNIQUE: Multiplanar, multiecho pulse sequences of the brain and surrounding structures were obtained without and with intravenous contrast. CONTRAST:  38mL GADAVIST GADOBUTROL 1 MMOL/ML IV SOLN COMPARISON:  None Available. FINDINGS: Brain: No restricted diffusion to suggest acute or subacute infarct. No acute hemorrhage mass, mass effect, or midline shift. No hydrocephalus or extra-axial collection. No abnormal parenchymal or meningeal enhancement. Vascular: Normal arterial flow voids. Skull and upper cervical spine: Normal marrow signal. Sinuses/Orbits: Mucosal thickening in the right maxillary sinus and anterior right ethmoid air cells. Status post bilateral lens replacements. Other: Trace fluid in the left mastoid air cells. IMPRESSION: No acute intracranial process. No evidence of metastatic disease in the brain. Electronically Signed   By: Merilyn Baba M.D.   On: 08/12/2021 23:27   CT ABDOMEN PELVIS W CONTRAST  Result Date: 08/05/2021 CLINICAL DATA:  Chest pain.  Left thoracentesis yesterday. EXAM: CT ANGIOGRAPHY CHEST CT ABDOMEN AND PELVIS WITH CONTRAST TECHNIQUE: Multidetector CT imaging of the chest was performed using the standard protocol during bolus administration of intravenous contrast. Multiplanar CT image reconstructions and MIPs were obtained to evaluate the vascular anatomy. Multidetector CT imaging of the abdomen and pelvis was performed using the standard protocol during bolus administration of intravenous contrast. RADIATION DOSE REDUCTION: This exam was performed according to the departmental dose-optimization program which includes  automated exposure control, adjustment of the mA and/or kV according to patient size and/or use of iterative reconstruction technique. CONTRAST:  53mL OMNIPAQUE IOHEXOL 350 MG/ML SOLN COMPARISON:  Chest x-ray from same day. Cardiac PET-CT dated Jul 22, 2021. FINDINGS: CTA CHEST FINDINGS Cardiovascular: Satisfactory opacification of the pulmonary arteries to the segmental level. No evidence of pulmonary embolism. Normal heart size. No pericardial effusion. No thoracic aortic aneurysm or dissection. Coronary, aortic arch, and branch vessel atherosclerotic vascular disease. Mediastinum/Nodes: Mildly enlarged left hilar and ill-defined subcarinal lymph nodes measuring up to 1.4 cm in short axis. No enlarged axillary lymph nodes. The thyroid gland and esophagus demonstrate no significant findings. Lungs/Pleura: Trace secretions in the esophagus. Continued consolidation in the medial left lower lobe with heterogeneous enhancement and new small areas of cavitation. Adjacent to and inseparable from the medial left lower lobe consolidation is a 6.0 x 4.3 x 9.6 cm area low-density mass with thickened, irregular enhancing margins, abutting and inseparable from the descending thoracic aorta. This abnormal soft tissue contacts the distal esophagus as well and extends through the diaphragmatic hiatus to abut the celiac axis and left renal vein. Decreased small left pleural effusion with thin pleural enhancement. No pneumothorax. Bilateral calcified pleural plaques and moderate centrilobular emphysema again noted. 6 mm subpleural nodular density in the posterior right lower lobe was not present on the CT from 2 weeks ago and likely represents atelectasis.  No follow-up imaging is recommended. Musculoskeletal: Pectus excavatum deformity again noted. No acute or significant osseous findings. Review of the MIP images confirms the above findings. CT ABDOMEN AND PELVIS FINDINGS Hepatobiliary: No focal liver abnormality is seen. No  gallstones, gallbladder wall thickening, or biliary dilatation. Pancreas: Unremarkable. No pancreatic ductal dilatation or surrounding inflammatory changes. Spleen: Normal in size without focal abnormality. Adrenals/Urinary Tract: Adrenal glands are unremarkable. Kidneys are normal, without renal calculi, focal lesion, or hydronephrosis. Bladder is unremarkable. Stomach/Bowel: Stomach is within normal limits. Appendix appears normal. No evidence of bowel wall thickening, distention, or inflammatory changes. Moderate amount of stool in the colon. Vascular/Lymphatic: Aortic atherosclerosis. Abnormal soft tissue density extending from the thorax through the diaphragmatic hiatus to abut the celiac axis and left renal vein (series 2, images 14-21; series 5, images 34-42)). 1.3 cm aortocaval necrotic lymph node versus tumor extension (series 2, image 23). Reproductive: Borderline enlarged prostate gland. Other: No abdominal wall hernia or abnormality. No abdominopelvic ascites. No pneumoperitoneum. 1.1 cm necrotic nodule posterior to the right kidney (series 2, image 36). Musculoskeletal: No acute or significant osseous findings. 1.7 cm well-defined lucent lesion with thin sclerotic rim in the L5 vertebral body, likely benign cyst. Review of the MIP images confirms the above findings. IMPRESSION: 1. No evidence of pulmonary embolism. 2. Continued heterogeneously enhancing consolidation in the medial left lower lobe with new small areas of cavitation. Adjacent to and inseparable from the consolidation is a 6.0 x 4.3 x 9.6 cm low-density mass with thickened, irregular enhancing margins, abutting and inseparable from the descending thoracic aorta and distal esophagus. Constellation of findings is concerning for primary bronchogenic carcinoma with mediastinal invasion. There may be superimposed left lower lobe pneumonia as well. 3. Necrotic tumor also extends into the upper abdomen through the diaphragmatic hiatus and abuts  the celiac axis and left renal vein. 1.3 cm aortocaval necrotic lymph node versus tumor extension. 1.1 cm necrotic nodule posterior to the right kidney. 4. Mildly enlarged left hilar and ill-defined subcarinal lymphadenopathy, concerning for nodal metastatic disease. 5. Decreased small left pleural effusion with thin pleural enhancement, concerning for malignant effusion. 6. Aortic Atherosclerosis (ICD10-I70.0) and Emphysema (ICD10-J43.9). Electronically Signed   By: Titus Dubin M.D.   On: 08/05/2021 11:30   DG Abd Portable 1V  Result Date: 08/14/2021 CLINICAL DATA:  Abdominal distension in a 71 year old male. EXAM: PORTABLE ABDOMEN - 1 VIEW COMPARISON:  Aug 08, 2021. FINDINGS: Dense LEFT lower lobe consolidation and signs of LEFT-sided pleural effusion. Diminished stool burden perhaps compared to previous imaging still with moderate stool in the colon. No signs of small bowel dilation. On limited assessment no acute regional skeletal process. IMPRESSION: 1. Slightly diminished stool burden perhaps compared to previous imaging still with moderate stool in the colon. No signs of bowel obstruction. 2. Dense LEFT lower lobe consolidation with suspected increased pleural fluid in this patient with potential or suspected LEFT lower lobe mass may reflect worsening consolidation superimposed on known masslike area. Electronically Signed   By: Zetta Bills M.D.   On: 08/14/2021 10:48   NM PET CT CARDIAC PERFUSION MULTI W/ABSOLUTE BLOODFLOW  Result Date: 07/23/2021   The study is normal. The study is low risk.   LV perfusion is normal. There is no evidence of ischemia. There is no evidence of infarction.   Rest left ventricular function is normal. Rest EF: 71 %. Stress left ventricular function is normal. Stress EF: 79 %. End diastolic cavity size is normal. End systolic cavity size  is normal.   Myocardial blood flow was computed to be 1.67ml/g/min at rest and 3.72ml/g/min at stress. Global myocardial blood flow  reserve was 2.33 and was normal.   Coronary calcium was present on the attenuation correction CT images in the RCA. Assessment of left system not performed due to prior revascularization. Electronically signed by: Elouise Munroe, MD --------------------------------------------------------------------------- ---------------------------------------------- CLINICAL DATA:  This over-read does not include interpretation of cardiac or coronary anatomy or pathology. The Cardiac PET CT interpretation by the cardiologist is attached. COMPARISON:  Chest radiograph 07/08/2021 FINDINGS: Vascular: Aortic atherosclerosis. Mediastinum/Nodes: No imaged thoracic adenopathy. Lungs/Pleura: Small left-sided pleural effusion. Bilateral calcified pleural plaques including on 32/3. Moderate centrilobular emphysema. Left lower lobe central airspace disease. 4 mm nodule on the right minor fissure. Upper Abdomen: Normal imaged portions of the liver, spleen, stomach, adrenal glands, left kidney. Musculoskeletal: Pectus excavatum deformity. IMPRESSION: 1. Left-sided pleural effusion with left lower lobe airspace disease, favoring pneumonia. Recommend appropriate antibiotic therapy and follow-up plain films in 4-6 weeks to confirm resolution. 2. Bilateral calcified pleural plaques, indicative of asbestos related pleural disease. 3. Aortic atherosclerosis (ICD10-I70.0) and emphysema (ICD10-J43.9). 4. 4 mm right-sided pulmonary nodule. Non-contrast chest CT can be considered in 12 months, given risk factors for primary bronchogenic carcinoma. This recommendation follows the consensus statement: Guidelines for Management of Incidental Pulmonary Nodules Detected on CT Images: From the Fleischner Society 2017; Radiology 2017; 284:228-243. Electronically Signed   By: Abigail Miyamoto M.D.   On: 07/22/2021 13:33  IR THORACENTESIS ASP PLEURAL SPACE W/IMG GUIDE  Result Date: 08/04/2021 INDICATION: Shortness of breath. Left-sided pleural effusion.  Request for diagnostic and therapeutic thoracentesis. EXAM: ULTRASOUND GUIDED LEFT THORACENTESIS MEDICATIONS: 1% plain lidocaine, 3 mL COMPLICATIONS: None immediate. PROCEDURE: An ultrasound guided thoracentesis was thoroughly discussed with the patient and questions answered. The benefits, risks, alternatives and complications were also discussed. The patient understands and wishes to proceed with the procedure. Written consent was obtained. Ultrasound was performed to localize and mark an adequate pocket of fluid in the left chest. The area was then prepped and draped in the normal sterile fashion. 1% Lidocaine was used for local anesthesia. Under ultrasound guidance a 6 Fr Safe-T-Centesis catheter was introduced. Thoracentesis was performed. The catheter was removed and a dressing applied. FINDINGS: A total of approximately 550 mL of hazy, blood-tinged fluid was removed. Samples were sent to the laboratory as requested by the clinical team. IMPRESSION: Successful ultrasound guided left thoracentesis yielding 550 mL of pleural fluid. Read by: Ascencion Dike PA-C Electronically Signed   By: Ruthann Cancer M.D.   On: 08/04/2021 11:43       The results of significant diagnostics from this hospitalization (including imaging, microbiology, ancillary and laboratory) are listed below for reference.     Microbiology: Recent Results (from the past 240 hour(s))  Fungus Culture With Stain     Status: None (Preliminary result)   Collection Time: 08/11/21 12:55 PM   Specimen: PATH Cytology FNA; Body Fluid  Result Value Ref Range Status   Fungus Stain Final report  Final    Comment: (NOTE) Performed At: [facility] [place], [place] [id number] Rush Farmer MD OF:[id number]    Fungus (Mycology) Culture PENDING  Incomplete   Fungal Source FLUID  Final    Comment: ESOPHAGOGATRODUODENOSCOPY BODY FLUID Performed at [facility], [place] [address], [place], [place]  [address]   Acid Fast Smear (AFB)     Status: None   Collection Time: 08/11/21 12:55 PM  Specimen: PATH Cytology FNA; Body Fluid  Result Value Ref Range Status   AFB Specimen Processing Concentration  Final   Acid Fast Smear Negative  Corrected    Comment: (NOTE) Performed At: [facility] [address] [place], [place] [id number] Rush Farmer MD WU:[id number] CORRECTED ON 05/26 AT 9562: PREVIOUSLY REPORTED AS Comment    Source (AFB) FLUID  Final    Comment: Performed at [facility], [place] [address] [place], [place] [address]  Anaerobic culture w Gram Stain     Status: None (Preliminary result)   Collection Time: 08/11/21 12:55 PM   Specimen: PATH Cytology FNA; Body Fluid  Result Value Ref Range Status   Specimen Description   Final    FLUID Performed at [place] [address], [place], [place] [address]    Special Requests ESOPHAGOGASTRODUODENOSCOPY BODY FLUID  Final   Gram Stain   Final    FEW WBC PRESENT, PREDOMINANTLY MONONUCLEAR NO ORGANISMS SEEN Performed at [facility], [place] [address], [place], [place] [address]    Culture   Final    NO ANAEROBES ISOLATED; CULTURE IN PROGRESS FOR 5 DAYS   Report Status PENDING  Incomplete  Body fluid culture w Gram Stain     Status: None   Collection Time: 08/11/21 12:55 PM   Specimen: PATH Cytology FNA; Body Fluid  Result Value Ref Range Status   Specimen Description   Final    FLUID Performed at [place] [address] [place], [place] [address]    Special Requests ESOPHAGOGASTRODUODENOSCOPY BODY FLUID  Final   Gram Stain   Final    FEW WBC PRESENT, PREDOMINANTLY MONONUCLEAR NO ORGANISMS SEEN    Culture   Final    NO GROWTH Performed at [facility], [place] [address] [place], [place] [address]    Report Status 08/15/2021 FINAL  Final  Fungus Culture Result     Status: None   Collection Time: 08/11/21 12:55 PM  Result Value Ref Range  Status   Result 1 Comment  Final    Comment: (NOTE) KOH/Calcofluor preparation:  no fungus observed. Performed At: [facility] [place], [place] [id number] Rush Farmer MD QI:[id number]      Labs:  CBC: Recent Labs  Lab 08/09/21 0819 08/11/21 0841 08/12/21 0330  WBC 14.6* 14.8* 12.2*  HGB 13.3 13.5 12.1*  HCT 41.1 40.6 37.1*  MCV 90.3 89.0 89.2  PLT 281 287 250   BMP &GFR Recent Labs  Lab 08/09/21 0819 08/11/21 0841 08/12/21 0330  NA 139 136 136  K 4.4 3.3* 4.5  CL 101 99 102  CO2 30 28 28   GLUCOSE 107* 106* 141*  BUN 24* 14 16  CREATININE 0.75 0.74 0.70  CALCIUM 9.3 8.8* 8.6*   Estimated Creatinine Clearance: 57.4 mL/min (by C-G formula based on SCr of 0.7 mg/dL). Liver & Pancreas: Recent Labs  Lab 08/11/21 0841  AST 22  ALT 22  ALKPHOS 83  BILITOT 0.9  PROT 6.6  ALBUMIN 3.1*   No results for input(s): LIPASE, AMYLASE in the last 168 hours. No results for input(s): AMMONIA in the last 168 hours. Diabetic: No results for input(s): HGBA1C in the last 72 hours. No results for input(s): GLUCAP in the last 168 hours. Cardiac Enzymes: No results for input(s): CKTOTAL, CKMB, CKMBINDEX, TROPONINI in the last 168 hours. No results for input(s): PROBNP in the last 8760 hours. Coagulation Profile: Recent Labs  Lab  08/11/21 0841  INR 1.3*   Thyroid Function Tests: No results for input(s): TSH, T4TOTAL, FREET4, T3FREE, THYROIDAB in the last 72 hours. Lipid Profile: No results for input(s): CHOL, HDL, LDLCALC, TRIG, CHOLHDL, LDLDIRECT in the last 72 hours. Anemia Panel: No results for input(s): VITAMINB12, FOLATE, FERRITIN, TIBC, IRON, RETICCTPCT in the last 72 hours. Urine analysis:    Component Value Date/Time   COLORURINE YELLOW 07/01/2009 1234   APPEARANCEUR CLEAR 07/01/2009 1234   LABSPEC 1.017 07/01/2009 1234   PHURINE 7.5 07/01/2009 1234   GLUCOSEU NEGATIVE 07/01/2009 1234   HGBUR NEGATIVE 07/01/2009 1234   BILIRUBINUR  NEGATIVE 07/01/2009 1234   KETONESUR NEGATIVE 07/01/2009 1234   PROTEINUR NEGATIVE 07/01/2009 1234   UROBILINOGEN 1.0 07/01/2009 1234   NITRITE NEGATIVE 07/01/2009 1234   LEUKOCYTESUR  07/01/2009 1234    NEGATIVE MICROSCOPIC NOT DONE ON URINES WITH NEGATIVE PROTEIN, BLOOD, LEUKOCYTES, NITRITE, OR GLUCOSE <1000 mg/dL.   Sepsis Labs: Invalid input(s): PROCALCITONIN, LACTICIDVEN   Time coordinating discharge: 45 minutes  SIGNED:  Mercy Riding, MD  [facility] 08/15/2021, 9:59 PM

## 2021-08-15 NOTE — Plan of Care (Signed)

## 2021-08-16 LAB — ANAEROBIC CULTURE W GRAM STAIN

## 2021-08-22 ENCOUNTER — Ambulatory Visit: Payer: Medicare Other | Admitting: Interventional Cardiology

## 2021-08-27 ENCOUNTER — Telehealth: Payer: Self-pay | Admitting: Hematology and Oncology

## 2021-08-27 NOTE — Telephone Encounter (Signed)
.  Called patient to schedule appointment per 6/6 inbasket, patient wife informed me that pt was in hospice care now, desk nurse notified

## 2021-09-01 ENCOUNTER — Ambulatory Visit: Payer: Medicare Other | Admitting: Pulmonary Disease

## 2021-09-02 LAB — FUNGUS CULTURE RESULT

## 2021-09-02 LAB — FUNGUS CULTURE WITH STAIN

## 2021-09-02 LAB — FUNGAL ORGANISM REFLEX

## 2021-09-11 LAB — FUNGUS CULTURE WITH STAIN

## 2021-09-11 LAB — FUNGUS CULTURE RESULT

## 2021-09-11 LAB — FUNGAL ORGANISM REFLEX

## 2021-09-20 LAB — ACID FAST CULTURE WITH REFLEXED SENSITIVITIES (MYCOBACTERIA): Acid Fast Culture: NEGATIVE

## 2021-09-20 DEATH — deceased

## 2021-09-25 LAB — ACID FAST CULTURE WITH REFLEXED SENSITIVITIES (MYCOBACTERIA): Acid Fast Culture: NEGATIVE

## 2023-05-22 IMAGING — CT NM PET CT CARDIAC PERF MULTI W/ABSOLUTE BLOODFLOW
1 of 8 series · 1 of 25 positions shown · non-contrast
Comparison: Chest radiograph 07/08/2021

CLINICAL DATA: This over-read does not include interpretation of
cardiac or coronary anatomy or pathology. The Cardiac PET CT
interpretation by the cardiologist is attached.

[Series 9: pet rest cardiac gated · axial · 3.0mm · 2.04mm/px · 1 of 664 slices shown]
[im 664/664]
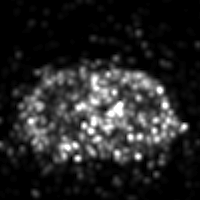

[1 of 25 positions shown; findings below may reference images not displayed]

FINDINGS: Vascular: Aortic atherosclerosis.

Mediastinum/Nodes: No imaged thoracic adenopathy.

Lungs/Pleura: Small left-sided pleural effusion. Bilateral calcified
pleural plaques including on 32/3.

Moderate centrilobular emphysema. Left lower lobe central airspace
disease.

4 mm nodule on the right minor fissure.

Upper Abdomen: Normal imaged portions of the liver, spleen, stomach,
adrenal glands, left kidney.

Musculoskeletal: Pectus excavatum deformity.
IMPRESSION: 1. Left-sided pleural effusion with left lower lobe airspace
disease, favoring pneumonia. Recommend appropriate antibiotic
therapy and follow-up plain films in 4-6 weeks to confirm
resolution.
2. Bilateral calcified pleural plaques, indicative of asbestos
related pleural disease.
3. Aortic atherosclerosis ([id number]) and emphysema ([id number].K).
4. 4 mm right-sided pulmonary nodule. Non-contrast chest CT can be
considered in 12 months, given risk factors for primary bronchogenic
carcinoma. This recommendation follows the consensus statement:
Guidelines for Management of Incidental Pulmonary Nodules Detected

## 2024-03-03 ENCOUNTER — Other Ambulatory Visit (HOSPITAL_COMMUNITY): Payer: Self-pay
